# Patient Record
Sex: Female | Born: 1985 | Race: Black or African American | Hispanic: No | Marital: Married | Smoking: Never smoker
Health system: Southern US, Community
[De-identification: ages and names within clinical notes are randomized; demographics above are authoritative.]

## PROBLEM LIST (undated history)

## (undated) ENCOUNTER — Inpatient Hospital Stay (HOSPITAL_COMMUNITY): Payer: Self-pay

## (undated) DIAGNOSIS — F419 Anxiety disorder, unspecified: Secondary | ICD-10-CM

## (undated) DIAGNOSIS — F909 Attention-deficit hyperactivity disorder, unspecified type: Secondary | ICD-10-CM

## (undated) DIAGNOSIS — I10 Essential (primary) hypertension: Secondary | ICD-10-CM

## (undated) DIAGNOSIS — E669 Obesity, unspecified: Secondary | ICD-10-CM

## (undated) DIAGNOSIS — R87629 Unspecified abnormal cytological findings in specimens from vagina: Secondary | ICD-10-CM

## (undated) DIAGNOSIS — E039 Hypothyroidism, unspecified: Secondary | ICD-10-CM

## (undated) HISTORY — DX: Attention-deficit hyperactivity disorder, unspecified type: F90.9

## (undated) HISTORY — PX: DILATATION & CURRETTAGE/HYSTEROSCOPY WITH RESECTOCOPE: SHX5572

## (undated) HISTORY — PX: NO PAST SURGERIES: SHX2092

## (undated) HISTORY — DX: Obesity, unspecified: E66.9

## (undated) HISTORY — DX: Anxiety disorder, unspecified: F41.9

## (undated) HISTORY — DX: Unspecified abnormal cytological findings in specimens from vagina: R87.629

## (undated) HISTORY — PX: COLPOSCOPY: SHX161

---
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
## 2006-01-23 ENCOUNTER — Ambulatory Visit: Payer: Self-pay | Admitting: Family Medicine

## 2006-01-25 ENCOUNTER — Encounter: Admission: RE | Admit: 2006-01-25 | Discharge: 2006-01-25 | Payer: Self-pay | Admitting: Family Medicine

## 2011-01-29 LAB — ANTIBODY SCREEN: Antibody Screen: NEGATIVE

## 2011-01-29 LAB — ABO/RH: RH Type: POSITIVE

## 2011-01-29 LAB — HIV ANTIBODY (ROUTINE TESTING W REFLEX): HIV: NONREACTIVE

## 2011-04-14 LAB — HIV ANTIBODY (ROUTINE TESTING W REFLEX): HIV: NONREACTIVE

## 2011-04-14 LAB — RPR: RPR: NONREACTIVE

## 2011-06-29 ENCOUNTER — Inpatient Hospital Stay (HOSPITAL_COMMUNITY)
Admission: AD | Admit: 2011-06-29 | Discharge: 2011-06-29 | Disposition: A | Discharge status: Home / Self Care | Payer: BC Managed Care – PPO | Source: Ambulatory Visit | Attending: Obstetrics and Gynecology | Admitting: Obstetrics and Gynecology

## 2011-06-29 ENCOUNTER — Inpatient Hospital Stay (HOSPITAL_COMMUNITY): Payer: BC Managed Care – PPO

## 2011-06-29 ENCOUNTER — Encounter (HOSPITAL_COMMUNITY): Payer: Self-pay | Admitting: *Deleted

## 2011-06-29 DIAGNOSIS — IMO0002 Reserved for concepts with insufficient information to code with codable children: Secondary | ICD-10-CM | POA: Insufficient documentation

## 2011-06-29 DIAGNOSIS — M259 Joint disorder, unspecified: Secondary | ICD-10-CM | POA: Insufficient documentation

## 2011-06-29 DIAGNOSIS — Y990 Civilian activity done for income or pay: Secondary | ICD-10-CM | POA: Insufficient documentation

## 2011-06-29 DIAGNOSIS — X500XXA Overexertion from strenuous movement or load, initial encounter: Secondary | ICD-10-CM | POA: Insufficient documentation

## 2011-06-29 DIAGNOSIS — Y9229 Other specified public building as the place of occurrence of the external cause: Secondary | ICD-10-CM | POA: Insufficient documentation

## 2011-06-29 DIAGNOSIS — M899 Disorder of bone, unspecified: Secondary | ICD-10-CM | POA: Insufficient documentation

## 2011-06-29 LAB — WET PREP, GENITAL
Clue Cells Wet Prep HPF POC: NONE SEEN
Trich, Wet Prep: NONE SEEN
Yeast Wet Prep HPF POC: NONE SEEN

## 2011-06-29 MED ORDER — CYCLOBENZAPRINE HCL 10 MG PO TABS: 10.0000 mg | ORAL_TABLET | Freq: Three times a day (TID) | ORAL | Status: DC | PRN

## 2011-06-29 MED ORDER — CYCLOBENZAPRINE HCL 10 MG PO TABS
10.0000 mg | ORAL_TABLET | Freq: Once | ORAL | Status: AC
  Administered 2011-06-29: 10 mg via ORAL
  Filled 2011-06-29: qty 1

## 2011-06-29 NOTE — ED Provider Notes (Signed)
Chief Complaint:  Contractions   Brandy Pierce is  25 y.o. G2P0010.  Patient's last menstrual period was 01/31/2011.. [redacted]w[redacted]d   She presents complaining of Contractions and constant sharp shooting pain in left groin. States that she lifted a toddler at work and immediately left a sharp pain in groin that radiates into the vaginal. Reports tightening of left side q 3-5 minutes immediately following picking up the child, but have now resolved. Rate pain 9/10 at present, worse when lying on back some relief on side. Reports + FM, denies vag blding   Obstetrical/Gynecological History: OB History    Grav Para Term Preterm Abortions TAB SAB Ect Mult Living   2    1 1     0      Past Medical History: Past Medical History  Diagnosis Date  . No pertinent past medical history     Past Surgical History: Past Surgical History  Procedure Date  . No past surgeries     Family History: No family history on file.  Social History: History  Substance Use Topics  . Smoking status: Never Smoker   . Smokeless tobacco: Not on file  . Alcohol Use: No    Allergies: No Known Allergies  Prescriptions prior to admission  Medication Sig Dispense Refill  . prenatal vitamin w/FE, FA (PRENATAL 1 + 1) 27-1 MG TABS Take 1 tablet by mouth at bedtime.          Review of Systems - Negative except what is reviewed in the HPI  Physical Exam   Blood pressure 139/81, pulse 93, temperature 99 F (37.2 C), temperature source Oral, resp. rate 18, height 5\' 5"  (1.651 m), weight 107.956 kg (238 lb), last menstrual period 01/31/2011.  General: General appearance - alert, well appearing, and in no distress and overweight, uncomfortable appearing Mental status - alert, oriented to person, place, and time, normal mood, behavior, speech, dress, motor activity, and thought processes Abdomen - obese, soft, tenderness noted in left groin with palpation, no rebound tenderness noted Back exam - full range of motion, no  tenderness, palpable spasm or pain on motion Musculoskeletal - no joint tenderness, deformity or swelling Extremities - peripheral pulses normal, no pedal edema, no clubbing or cyanosis Focused Gynecological Exam: VULVA: normal appearing vulva with no masses, tenderness or lesions, VAGINA: vaginal discharge - white and copious, CERVIX: closed/thick, UTERUS: enlarged to 20 week's size, nontender, ADNEXA: normal adnexa in size, nontender and no masses  Labs: Recent Results (from the past 24 hour(s))  WET PREP, GENITAL   Collection Time   06/29/11  1:05 PM      Component Value Range   Yeast, Wet Prep NONE SEEN  NONE SEEN    Trich, Wet Prep NONE SEEN  NONE SEEN    Clue Cells, Wet Prep NONE SEEN  NONE SEEN    WBC, Wet Prep HPF POC FEW (*) NONE SEEN    Imaging Studies:  Ultrasound: Flow to both ovaries, no retroplacental bleed, + FM, CL 3.24cm   ED Course: Discussed with Dr. Vincente Poli. Obtain US  Assessment: Muscle Strain of Groin Pregnancy  Plan: Discharge home Out of work til Monday Rx Flexeril 10mg  prn   Zacharius Funari E. 06/29/2011,1:47 PM

## 2011-06-29 NOTE — Progress Notes (Signed)
Pt reprots she was at work and ran after a toddler and leaned down to catch him and pick him up felt a tug on her left side and had bad sharp pain and fel like it was pulsating and pt cant stand straight up. Feels like she is having ctx. Pt still having difficulty walking and pain isworse.

## 2011-07-07 ENCOUNTER — Inpatient Hospital Stay (HOSPITAL_COMMUNITY)
Admission: AD | Admit: 2011-07-07 | Discharge: 2011-07-07 | Disposition: A | Discharge status: Home / Self Care | Payer: BC Managed Care – PPO | Source: Ambulatory Visit | Attending: Obstetrics and Gynecology | Admitting: Obstetrics and Gynecology

## 2011-07-07 ENCOUNTER — Encounter (HOSPITAL_COMMUNITY): Payer: Self-pay

## 2011-07-07 ENCOUNTER — Inpatient Hospital Stay (HOSPITAL_COMMUNITY): Payer: BC Managed Care – PPO

## 2011-07-07 DIAGNOSIS — R109 Unspecified abdominal pain: Secondary | ICD-10-CM | POA: Insufficient documentation

## 2011-07-07 DIAGNOSIS — N949 Unspecified condition associated with female genital organs and menstrual cycle: Secondary | ICD-10-CM

## 2011-07-07 DIAGNOSIS — O99891 Other specified diseases and conditions complicating pregnancy: Secondary | ICD-10-CM | POA: Insufficient documentation

## 2011-07-07 NOTE — Progress Notes (Signed)
Pt sent over from office for possible inguinal hernia, reports spasms and sharp pains in llq.  States she believes she pulled a muscle last Thursday.

## 2011-07-07 NOTE — ED Provider Notes (Signed)
History     Chief Complaint  Patient presents with  . Abdominal Pain   HPI Patient is a 25yo G2P0010 at 22.3 weeks.  Seen for left sided abdominal pain that started approximately 1 week ago with radiation into the pelvis.  Pain began after lifting a child at work.  Was evaluated here last week and diagnosed with round ligament strain.  Pain hasn't resolved or decreased.   Denies pain radiating into leg, fevers, chills, nausea, vomiting, diarrhea, numbness, weakness.  The patient was seen by her Dr's office today, who sent her here for an ultrasound to r/o hernia.  OB History    Grav Para Term Preterm Abortions TAB SAB Ect Mult Living   2    1 1     0      Past Medical History  Diagnosis Date  . No pertinent past medical history     Past Surgical History  Procedure Date  . No past surgeries     No family history on file.  History  Substance Use Topics  . Smoking status: Never Smoker   . Smokeless tobacco: Not on file  . Alcohol Use: No    Allergies: No Known Allergies  Prescriptions prior to admission  Medication Sig Dispense Refill  . cyclobenzaprine (FLEXERIL) 10 MG tablet Take 1 tablet (10 mg total) by mouth every 8 (eight) hours as needed for muscle spasms.  30 tablet  1  . oxyCODONE-acetaminophen (PERCOCET) 5-325 MG per tablet Take 1 tablet by mouth every 4 (four) hours as needed.        . prenatal vitamin w/FE, FA (PRENATAL 1 + 1) 27-1 MG TABS Take 1 tablet by mouth at bedtime.          Review of Systems  All other systems reviewed and are negative.   Physical Exam   Blood pressure 156/101, pulse 133, temperature 98.3 F (36.8 C), temperature source Oral, resp. rate 16, height 5\' 6"  (1.676 m), weight 112.129 kg (247 lb 3.2 oz), last menstrual period 01/31/2011.  Physical Exam  Constitutional: She appears well-developed and well-nourished.  Eyes: Pupils are equal, round, and reactive to light.  GI: Soft. She exhibits no distension and no mass. There is  tenderness. There is no rebound and no guarding.   Abdominal US shows no evidence of femoral hernia. MAU Course  Procedures   Assessment and Plan  1. Round Ligament pain. Will give the patient lidocaine patch for pain.  Pt to follow up with Primary OB in 3 days.  Will have pt out of work for 1 week.   Kateland Leisinger JEHIEL 07/07/2011, 3:41 PM

## 2011-07-07 NOTE — Progress Notes (Signed)
Left lower abdominal pain since 1 week sent for [place]

## 2011-08-31 ENCOUNTER — Other Ambulatory Visit: Payer: Self-pay | Admitting: Obstetrics and Gynecology

## 2011-08-31 ENCOUNTER — Inpatient Hospital Stay (HOSPITAL_COMMUNITY)
Admission: AD | Admit: 2011-08-31 | Discharge: 2011-08-31 | Disposition: A | Discharge status: Home / Self Care | Payer: BC Managed Care – PPO | Source: Ambulatory Visit | Attending: Obstetrics and Gynecology | Admitting: Obstetrics and Gynecology

## 2011-08-31 ENCOUNTER — Encounter (HOSPITAL_COMMUNITY): Payer: Self-pay

## 2011-08-31 DIAGNOSIS — O139 Gestational [pregnancy-induced] hypertension without significant proteinuria, unspecified trimester: Secondary | ICD-10-CM

## 2011-08-31 DIAGNOSIS — O99891 Other specified diseases and conditions complicating pregnancy: Secondary | ICD-10-CM | POA: Insufficient documentation

## 2011-08-31 DIAGNOSIS — O169 Unspecified maternal hypertension, unspecified trimester: Secondary | ICD-10-CM

## 2011-08-31 DIAGNOSIS — R030 Elevated blood-pressure reading, without diagnosis of hypertension: Secondary | ICD-10-CM | POA: Insufficient documentation

## 2011-08-31 DIAGNOSIS — IMO0002 Reserved for concepts with insufficient information to code with codable children: Secondary | ICD-10-CM

## 2011-08-31 HISTORY — DX: Hypothyroidism, unspecified: E03.9

## 2011-08-31 LAB — CBC
HCT: 34.1 % — ABNORMAL LOW (ref 36.0–46.0)
Hemoglobin: 11.4 g/dL — ABNORMAL LOW (ref 12.0–15.0)
MCH: 29.2 pg (ref 26.0–34.0)
MCHC: 33.4 g/dL (ref 30.0–36.0)
RDW: 12.7 % (ref 11.5–15.5)

## 2011-08-31 LAB — COMPREHENSIVE METABOLIC PANEL
Albumin: 3.1 g/dL — ABNORMAL LOW (ref 3.5–5.2)
Alkaline Phosphatase: 67 U/L (ref 39–117)
BUN: 5 mg/dL — ABNORMAL LOW (ref 6–23)
Calcium: 9.7 mg/dL (ref 8.4–10.5)
Creatinine, Ser: 0.56 mg/dL (ref 0.50–1.10)
GFR calc Af Amer: >90 mL/min (ref >90)
Glucose, Bld: 87 mg/dL (ref 70–99)
Potassium: 3.6 mEq/L (ref 3.5–5.1)
Total Protein: 6.4 g/dL (ref 6.0–8.3)

## 2011-08-31 LAB — URIC ACID: Uric Acid, Serum: 5.1 mg/dL (ref 2.4–7.0)

## 2011-08-31 NOTE — ED Provider Notes (Signed)
History     Chief Complaint  Patient presents with  . Hypertension   HPI  Pt is 30weeks 2 d pregnant and was seen in the office this morning for 2 week check up with elevated BP of 150/?Marland Kitchen  Brandy Pierce has had 2 week interval check ups with BP and has had elevated GTT.  Brandy Pierce had a fall Sept 6  With lots of pain with round ligament pain.  Brandy Pierce had a headache from 6 pm until 11 am which Brandy Pierce did not take any medication.  The headache is gone now.  Brandy Pierce typically has dry heaves in the morning- no contractions no pelvic pressure and no diarrhea or contsipation or UTI symptoms.  Brandy Pierce is here for [facility] evaluation.  Brandy Pierce denies vaginal discharge or leakage or bleeding.    Past Medical History  Diagnosis Date  . Polycystic disease, ovaries   . Hypothyroidism     Past Surgical History  Procedure Date  . No past surgeries     No family history on file.  History  Substance Use Topics  . Smoking status: Never Smoker   . Smokeless tobacco: Not on file  . Alcohol Use: No    Allergies: No Known Allergies  Prescriptions prior to admission  Medication Sig Dispense Refill  . acetaminophen (TYLENOL) 325 MG tablet Take 650 mg by mouth every 6 (six) hours as needed. For headache       . calcium carbonate (TUMS - DOSED IN MG ELEMENTAL CALCIUM) 500 MG chewable tablet Chew 1 tablet by mouth daily as needed. For heartburn       . prenatal vitamin w/FE, FA (PRENATAL 1 + 1) 27-1 MG TABS Take 1 tablet by mouth at bedtime.          Review of Systems  Constitutional: Negative for fever and chills.  Eyes: Negative for blurred vision and double vision.  Genitourinary: Negative for dysuria and frequency.  Neurological: Negative for dizziness and headaches.   Physical Exam   Blood pressure 129/84, pulse 100, temperature 99.1 F (37.3 C), temperature source Oral, resp. rate 20, height 5\' 6"  (1.676 m), weight 261 lb 3.2 oz (118.48 kg), last menstrual period 01/31/2011, SpO2 98.00%.  Physical Exam  Constitutional: Brandy Pierce  is oriented to person, place, and time. Brandy Pierce appears well-developed and well-nourished.  Eyes: Pupils are equal, round, and reactive to light.  Neck: Normal range of motion.  Cardiovascular: Normal rate.   Respiratory: Effort normal.  GI: Soft. Brandy Pierce exhibits no distension.       FHR reactive 15x15 without decelerations; no ctx noted  Musculoskeletal: Normal range of motion.  Neurological: Brandy Pierce is alert and oriented to person, place, and time.  Skin: Skin is warm and dry.  Psychiatric: Brandy Pierce has a normal mood and affect.    MAU Course  Procedures U/a negative in  Office Serial BP with last BP 129/84 PIH labs normal Discussed with Dr. Vincente Poli- pt may go home and f/u in office next week for BP and OB f/u visit      Harvy Riera 08/31/2011, 11:22 AM

## 2011-08-31 NOTE — Progress Notes (Signed)
Patient states she was sent from the office for evaluation of elevated blood pressure. Has some swelling and pelvic pain. Reports good fetal movement.

## 2011-09-27 ENCOUNTER — Inpatient Hospital Stay (HOSPITAL_COMMUNITY)
Admission: AD | Admit: 2011-09-27 | Discharge: 2011-09-27 | Disposition: A | Discharge status: Home / Self Care | Payer: BC Managed Care – PPO | Source: Ambulatory Visit | Attending: Obstetrics and Gynecology | Admitting: Obstetrics and Gynecology

## 2011-09-27 ENCOUNTER — Other Ambulatory Visit: Payer: Self-pay | Admitting: Obstetrics and Gynecology

## 2011-09-27 ENCOUNTER — Inpatient Hospital Stay (HOSPITAL_COMMUNITY): Payer: BC Managed Care – PPO

## 2011-09-27 ENCOUNTER — Encounter (HOSPITAL_COMMUNITY): Payer: Self-pay | Admitting: *Deleted

## 2011-09-27 DIAGNOSIS — O368190 Decreased fetal movements, unspecified trimester, not applicable or unspecified: Secondary | ICD-10-CM | POA: Insufficient documentation

## 2011-09-27 DIAGNOSIS — O368130 Decreased fetal movements, third trimester, not applicable or unspecified: Secondary | ICD-10-CM

## 2011-09-27 DIAGNOSIS — Z8632 Personal history of gestational diabetes: Secondary | ICD-10-CM

## 2011-09-27 DIAGNOSIS — O99810 Abnormal glucose complicating pregnancy: Secondary | ICD-10-CM | POA: Insufficient documentation

## 2011-09-27 LAB — COMPREHENSIVE METABOLIC PANEL
Albumin: 3 g/dL — ABNORMAL LOW (ref 3.5–5.2)
Alkaline Phosphatase: 96 U/L (ref 39–117)
BUN: 5 mg/dL — ABNORMAL LOW (ref 6–23)
Creatinine, Ser: 0.61 mg/dL (ref 0.50–1.10)
GFR calc Af Amer: >90 mL/min (ref >90)
Glucose, Bld: 101 mg/dL — ABNORMAL HIGH (ref 70–99)
Potassium: 3.8 mEq/L (ref 3.5–5.1)
Total Protein: 6.6 g/dL (ref 6.0–8.3)

## 2011-09-27 LAB — CBC
HCT: 34.6 % — ABNORMAL LOW (ref 36.0–46.0)
Hemoglobin: 11.6 g/dL — ABNORMAL LOW (ref 12.0–15.0)
MCH: 28.5 pg (ref 26.0–34.0)
MCHC: 33.5 g/dL (ref 30.0–36.0)
MCV: 85 fL (ref 78.0–100.0)
RDW: 12.9 % (ref 11.5–15.5)

## 2011-09-27 NOTE — Progress Notes (Signed)
Was on monitor if office, 'not moving as much as they wanted'.  Is gest diabetic.  Has been trying to set up counseling, having problems with that.

## 2011-09-27 NOTE — Progress Notes (Signed)
No pain, is having contractions- but no pain.  Does have pelvic pressure.

## 2011-09-27 NOTE — ED Provider Notes (Signed)
History     Chief Complaint  Patient presents with  . Decreased Fetal Movement   HPI JALAYSHA SKILTON is 25 y.o. G2P0010 [redacted]w[redacted]d weeks presenting after office visit this am.  Had NST in office and was told it was not reactive and was sent to MAU for longer testing. States she had less fetal movement last night but has felt movement today.  Denies pain, vaginal bleeding or leaking of fluid. Feeling some tightening more frequently not stronger.  Increased pelvic pressure when she has the need to urinate that is resolved after urination.  Urine checked in office today.   Patient has hx of PCOS and has gestational diabetes with this pregnancy.    Past Medical History  Diagnosis Date  . Polycystic disease, ovaries   . Hypothyroidism   . Gestational diabetes     Past Surgical History  Procedure Date  . No past surgeries     Family History  Problem Relation Age of Onset  . Anesthesia problems Neg Hx     History  Substance Use Topics  . Smoking status: Never Smoker   . Smokeless tobacco: Never Used  . Alcohol Use: No    Allergies: No Known Allergies  Prescriptions prior to admission  Medication Sig Dispense Refill  . acetaminophen (TYLENOL) 325 MG tablet Take 650 mg by mouth every 6 (six) hours as needed. For headache       . calcium carbonate (TUMS - DOSED IN MG ELEMENTAL CALCIUM) 500 MG chewable tablet Chew 1 tablet by mouth daily as needed. For heartburn       . prenatal vitamin w/FE, FA (PRENATAL 1 + 1) 27-1 MG TABS Take 1 tablet by mouth at bedtime.          Review of Systems  Constitutional: Negative.   Gastrointestinal: Negative.   Genitourinary:       Occ tightening Negative for vaginal bleeding or leaking of fluid   Physical Exam   Blood pressure 136/80, pulse 106, temperature 98.8 F (37.1 C), temperature source Oral, resp. rate 20, height 5\' 6"  (1.676 m), weight 261 lb (118.389 kg), last menstrual period 01/31/2011.  Physical Exam  Constitutional: She is  oriented to person, place, and time. She appears well-developed and well-nourished. No distress.  HENT:  Head: Normocephalic.  Neurological: She is alert and oriented to person, place, and time.  Skin: Skin is warm and dry.   Results for orders placed during the hospital encounter of 09/27/11 (from the past 24 hour(s))  CBC     Status: Abnormal   Collection Time   09/27/11  1:41 PM      Component Value Range   WBC 11.2 (*) 4.0 - 10.5 (K/uL)   RBC 4.07  3.87 - 5.11 (MIL/uL)   Hemoglobin 11.6 (*) 12.0 - 15.0 (g/dL)   HCT 16.1 (*) 09.6 - 46.0 (%)   MCV 85.0  78.0 - 100.0 (fL)   MCH 28.5  26.0 - 34.0 (pg)   MCHC 33.5  30.0 - 36.0 (g/dL)   RDW 04.5  40.9 - 81.1 (%)   Platelets 239  150 - 400 (K/uL)  COMPREHENSIVE METABOLIC PANEL     Status: Abnormal   Collection Time   09/27/11  1:41 PM      Component Value Range   Sodium 137  135 - 145 (mEq/L)   Potassium 3.8  3.5 - 5.1 (mEq/L)   Chloride 104  96 - 112 (mEq/L)   CO2 21  19 - 32 (  mEq/L)   Glucose, Bld 101 (*) 70 - 99 (mg/dL)   BUN 5 (*) 6 - 23 (mg/dL)   Creatinine, Ser 1.61  0.50 - 1.10 (mg/dL)   Calcium 9.8  8.4 - 09.6 (mg/dL)   Total Protein 6.6  6.0 - 8.3 (g/dL)   Albumin 3.0 (*) 3.5 - 5.2 (g/dL)   AST 18  0 - 37 (U/L)   ALT 14  0 - 35 (U/L)   Alkaline Phosphatase 96  39 - 117 (U/L)   Total Bilirubin 0.4  0.3 - 1.2 (mg/dL)   GFR calc non Af Amer >90  >90 (mL/min)   GFR calc Af Amer >90  >90 (mL/min)   Ultrasound results:  FHR 138, cephalic, placenta posterior above cervical os, marginal insertion.  AFI 10.88, low-normal range.  largest pocket 3.76 cm.  BPP 8/8.  [redacted]w[redacted]d.   MAU Course  Procedures  FMS reactive, FHR 135 and rare contraction.  MDM 15:40.  Called Dr. Marcelle Overlie and reported Lab results, ultrasound and reactive NST results.  BPP 8/8.  He reviewed Dr. Marvetta Gibbons note and instructed me to tell her the office would call her for a followup appointment.    Assessment and Plan  A: Decreased fetal movement (non reassuring  NST in office)  P:  Reviewed results with the patient.  Reported the office would call her to schedule a followup appointment.   KEY,[name] 09/27/2011, 1:17 PM   Matt Holmes, NP 09/27/11 1553

## 2011-10-04 ENCOUNTER — Encounter: Payer: BC Managed Care – PPO | Attending: Obstetrics and Gynecology | Admitting: *Deleted

## 2011-10-04 DIAGNOSIS — E119 Type 2 diabetes mellitus without complications: Secondary | ICD-10-CM | POA: Insufficient documentation

## 2011-10-04 DIAGNOSIS — Z713 Dietary counseling and surveillance: Secondary | ICD-10-CM | POA: Insufficient documentation

## 2011-10-05 NOTE — Patient Instructions (Signed)
Goals:  Check glucose levels per MD as instructed  Follow Gestational Diabetes Diet as instructed  Call for follow-up as needed    

## 2011-10-05 NOTE — Progress Notes (Signed)
  Patient was seen on 10/04/2011 for Gestational Diabetes self-management class at the [facility]. The following learning objectives were met by the patient during this course:   States the definition of Gestational Diabetes  States why dietary management is important in controlling blood glucose  Describes the effects each nutrient has on blood glucose levels  Demonstrates ability to create a balanced meal plan  Demonstrates carbohydrate counting   States when to check blood glucose levels  Demonstrates proper blood glucose monitoring techniques  States the effect of stress and exercise on blood glucose levels  States the importance of limiting caffeine and abstaining from alcohol and smoking  Blood glucose monitor given: One Touch Ultra Mini BG Monitoring Kit Lot # [id number] X Exp: 02/2012 Blood glucose reading: 92 mg/dl  Patient instructed to monitor glucose levels: FBS: 60 - <90 2 hour: <120  *Patient received handouts:  Nutrition Diabetes and Pregnancy  Carbohydrate Counting List  Patient will be seen for follow-up as needed.

## 2011-10-12 LAB — GC/CHLAMYDIA PROBE AMP, GENITAL: Gonorrhea: NEGATIVE

## 2011-10-12 LAB — STREP B DNA PROBE: GBS: NEGATIVE

## 2011-10-20 ENCOUNTER — Encounter (HOSPITAL_COMMUNITY): Payer: Self-pay | Admitting: *Deleted

## 2011-10-20 ENCOUNTER — Inpatient Hospital Stay (HOSPITAL_COMMUNITY): Payer: BC Managed Care – PPO

## 2011-10-20 ENCOUNTER — Inpatient Hospital Stay (HOSPITAL_COMMUNITY)
Admission: AD | Admit: 2011-10-20 | Discharge: 2011-10-20 | Disposition: A | Discharge status: Home / Self Care | Payer: BC Managed Care – PPO | Source: Ambulatory Visit | Attending: Obstetrics and Gynecology | Admitting: Obstetrics and Gynecology

## 2011-10-20 DIAGNOSIS — O99810 Abnormal glucose complicating pregnancy: Secondary | ICD-10-CM | POA: Insufficient documentation

## 2011-10-20 DIAGNOSIS — O139 Gestational [pregnancy-induced] hypertension without significant proteinuria, unspecified trimester: Secondary | ICD-10-CM

## 2011-10-20 DIAGNOSIS — O169 Unspecified maternal hypertension, unspecified trimester: Secondary | ICD-10-CM

## 2011-10-20 LAB — COMPREHENSIVE METABOLIC PANEL
AST: 21 U/L (ref 0–37)
Albumin: 3 g/dL — ABNORMAL LOW (ref 3.5–5.2)
Alkaline Phosphatase: 145 U/L — ABNORMAL HIGH (ref 39–117)
BUN: 12 mg/dL (ref 6–23)
Chloride: 104 mEq/L (ref 96–112)
Potassium: 4.6 mEq/L (ref 3.5–5.1)
Sodium: 135 mEq/L (ref 135–145)
Total Bilirubin: 0.3 mg/dL (ref 0.3–1.2)
Total Protein: 6.2 g/dL (ref 6.0–8.3)

## 2011-10-20 LAB — CBC
HCT: 37.8 % (ref 36.0–46.0)
MCHC: 33.6 g/dL (ref 30.0–36.0)
Platelets: 254 10*3/uL (ref 150–400)
RDW: 13.5 % (ref 11.5–15.5)
WBC: 10.1 10*3/uL (ref 4.0–10.5)

## 2011-10-20 LAB — LACTATE DEHYDROGENASE: LDH: 225 U/L (ref 94–250)

## 2011-10-20 LAB — URINALYSIS, ROUTINE W REFLEX MICROSCOPIC
Bilirubin Urine: NEGATIVE
Leukocytes, UA: NEGATIVE
Nitrite: NEGATIVE
Specific Gravity, Urine: 1.02 (ref 1.005–1.030)
Urobilinogen, UA: 0.2 mg/dL (ref 0.0–1.0)
pH: 6.5 (ref 5.0–8.0)

## 2011-10-20 LAB — URIC ACID: Uric Acid, Serum: 6.9 mg/dL (ref 2.4–7.0)

## 2011-10-20 MED ORDER — OXYCODONE-ACETAMINOPHEN 5-325 MG PO TABS: 2.0000 | ORAL_TABLET | ORAL | Status: AC | PRN

## 2011-10-20 MED ORDER — ZOLPIDEM TARTRATE 5 MG PO TABS: 5.0000 mg | ORAL_TABLET | Freq: Every evening | ORAL | Status: DC | PRN

## 2011-10-20 MED ORDER — ACETAMINOPHEN 325 MG PO TABS: 650.0000 mg | ORAL_TABLET | Freq: Once | ORAL | Status: DC

## 2011-10-20 NOTE — Progress Notes (Signed)
Patient sent from the office for evaluation of elevated blood pressure. Reports good fetal movement, slight discharge that is not new, no bleeding or leaking and some pelvic pressure.

## 2011-10-20 NOTE — ED Provider Notes (Signed)
History     Chief Complaint  Patient presents with  . Hypertension   HPI   Pt was seen in the office this morning with BP 160/98.  She had a trace of protein in her urine.  She has had contractions since Tuesday.  Last night she had ctx 11 minutes apart. Pt states her lower back as well as her abdomen are uncomfortable, but she has not taken any medications for the pain.  She has had a difficult time sleeping due to her pain. She has had some clear vaginal discharge.  Her cervix was closed and thinning on Wednesday.  She has been seen on several occasions with PIH evaluation.  She also has gestational diabetes.  Her BS was 88 this morning. The baby is measuring large for dates. She denies headaches.  She has had nausea this morning vomiting. yellow bile.  Baby is active.    Past Medical History  Diagnosis Date  . Polycystic disease, ovaries   . Hypothyroidism   . Gestational diabetes   . Obesity     Past Surgical History  Procedure Date  . No past surgeries     Family History  Problem Relation Age of Onset  . Anesthesia problems Neg Hx     History  Substance Use Topics  . Smoking status: Never Smoker   . Smokeless tobacco: Never Used  . Alcohol Use: No    Allergies: No Known Allergies  Prescriptions prior to admission  Medication Sig Dispense Refill  . prenatal vitamin w/FE, FA (PRENATAL 1 + 1) 27-1 MG TABS Take 1 tablet by mouth at bedtime.         ROS Physical Exam   Blood pressure 139/96, pulse 86, resp. rate 20, height 5\' 5"  (1.651 m), weight 265 lb 12.8 oz (120.566 kg), last menstrual period 01/31/2011, SpO2 98.00%.  Physical Exam  Constitutional: She is oriented to person, place, and time. She appears well-developed and well-nourished.  HENT:  Head: Normocephalic.  Eyes: Pupils are equal, round, and reactive to light.  Neck: Normal range of motion. Neck supple.  Cardiovascular: Normal rate.        Serial BPs 139/92-144/101-150/89  Respiratory: Effort  normal.  GI: Soft. She exhibits no distension. There is no tenderness. There is no rebound.       Back pain and lower abdominal pain; rare contractions with reactive NST  Musculoskeletal: Normal range of motion. She exhibits no edema.  Neurological: She is alert and oriented to person, place, and time.  Skin: Skin is warm and dry.  Psychiatric: She has a normal mood and affect.    MAU Course  Procedures Results for orders placed during the hospital encounter of 10/20/11 (from the past 24 hour(s))  URINALYSIS, ROUTINE W REFLEX MICROSCOPIC     Status: Normal   Collection Time   10/20/11 11:50 AM      Component Value Range   Color, Urine YELLOW  YELLOW    APPearance CLEAR  CLEAR    Specific Gravity, Urine 1.020  1.005 - 1.030    pH 6.5  5.0 - 8.0    Glucose, UA NEGATIVE  NEGATIVE (mg/dL)   Hgb urine dipstick NEGATIVE  NEGATIVE    Bilirubin Urine NEGATIVE  NEGATIVE    Ketones, ur NEGATIVE  NEGATIVE (mg/dL)   Protein, ur NEGATIVE  NEGATIVE (mg/dL)   Urobilinogen, UA 0.2  0.0 - 1.0 (mg/dL)   Nitrite NEGATIVE  NEGATIVE    Leukocytes, UA NEGATIVE  NEGATIVE  URIC ACID     Status: Normal   Collection Time   10/20/11 12:00 PM      Component Value Range   Uric Acid, Serum 6.9  2.4 - 7.0 (mg/dL)  LACTATE DEHYDROGENASE     Status: Normal   Collection Time   10/20/11 12:00 PM      Component Value Range   LD 225  94 - 250 (U/L)  CBC     Status: Normal   Collection Time   10/20/11 12:00 PM      Component Value Range   WBC 10.1  4.0 - 10.5 (K/uL)   RBC 4.59  3.87 - 5.11 (MIL/uL)   Hemoglobin 12.7  12.0 - 15.0 (g/dL)   HCT 78.2  95.6 - 21.3 (%)   MCV 82.4  78.0 - 100.0 (fL)   MCH 27.7  26.0 - 34.0 (pg)   MCHC 33.6  30.0 - 36.0 (g/dL)   RDW 08.6  57.8 - 46.9 (%)   Platelets 254  150 - 400 (K/uL)  COMPREHENSIVE METABOLIC PANEL     Status: Abnormal   Collection Time   10/20/11 12:00 PM      Component Value Range   Sodium 135  135 - 145 (mEq/L)   Potassium 4.6  3.5 - 5.1 (mEq/L)    Chloride 104  96 - 112 (mEq/L)   CO2 21  19 - 32 (mEq/L)   Glucose, Bld 94  70 - 99 (mg/dL)   BUN 12  6 - 23 (mg/dL)   Creatinine, Ser 6.29  0.50 - 1.10 (mg/dL)   Calcium 9.8  8.4 - 52.8 (mg/dL)   Total Protein 6.2  6.0 - 8.3 (g/dL)   Albumin 3.0 (*) 3.5 - 5.2 (g/dL)   AST 21  0 - 37 (U/L)   ALT 16  0 - 35 (U/L)   Alkaline Phosphatase 145 (*) 39 - 117 (U/L)   Total Bilirubin 0.3  0.3 - 1.2 (mg/dL)   GFR calc non Af Amer 84 (*) >90 (mL/min)   GFR calc Af Amer >90  >90 (mL/min)  discussed with Dr. Arelia Sneddon- pt will be discharged home and return to MAU in the morning for repeat BP and BPP  Assessment and Plan  Hypertension in pregnancy Gestational Diabetes Normal PIH labs, reactive NST Back pain- prescription for Percocet and Ambien given to pt to help her rest at night- pt will try Tylenol for back pain this afternoon at home [facility] 6/8 F/u tomorrow morning in MAU for BP eval and repeat BPP- call Dr. Gabriel Cirri 10/20/2011, 12:19 PM

## 2011-10-24 ENCOUNTER — Encounter (HOSPITAL_COMMUNITY): Payer: Self-pay | Admitting: *Deleted

## 2011-10-24 ENCOUNTER — Inpatient Hospital Stay (HOSPITAL_COMMUNITY)
Admission: AD | Admit: 2011-10-24 | Discharge: 2011-10-29 | DRG: 774 | Disposition: A | Discharge status: Home / Self Care | Payer: Medicaid Other | Source: Ambulatory Visit | Attending: Obstetrics and Gynecology | Admitting: Obstetrics and Gynecology

## 2011-10-24 DIAGNOSIS — O1490 Unspecified pre-eclampsia, unspecified trimester: Secondary | ICD-10-CM

## 2011-10-24 DIAGNOSIS — IMO0002 Reserved for concepts with insufficient information to code with codable children: Secondary | ICD-10-CM | POA: Diagnosis present

## 2011-10-24 DIAGNOSIS — D649 Anemia, unspecified: Secondary | ICD-10-CM

## 2011-10-24 DIAGNOSIS — O99814 Abnormal glucose complicating childbirth: Secondary | ICD-10-CM | POA: Diagnosis present

## 2011-10-24 LAB — URINALYSIS, ROUTINE W REFLEX MICROSCOPIC
Ketones, ur: NEGATIVE mg/dL
Leukocytes, UA: NEGATIVE
Nitrite: NEGATIVE
Protein, ur: 100 mg/dL — AB

## 2011-10-24 LAB — COMPREHENSIVE METABOLIC PANEL
Albumin: 2.9 g/dL — ABNORMAL LOW (ref 3.5–5.2)
BUN: 10 mg/dL (ref 6–23)
CO2: 24 mEq/L (ref 19–32)
Calcium: 9.2 mg/dL (ref 8.4–10.5)
Chloride: 106 mEq/L (ref 96–112)
Creatinine, Ser: 0.95 mg/dL (ref 0.50–1.10)
GFR calc non Af Amer: 83 mL/min — ABNORMAL LOW (ref >90)
Total Bilirubin: 0.3 mg/dL (ref 0.3–1.2)

## 2011-10-24 LAB — URIC ACID: Uric Acid, Serum: 7.1 mg/dL — ABNORMAL HIGH (ref 2.4–7.0)

## 2011-10-24 LAB — CBC
HCT: 35.5 % — ABNORMAL LOW (ref 36.0–46.0)
MCH: 27.7 pg (ref 26.0–34.0)
MCV: 82.8 fL (ref 78.0–100.0)
RDW: 13.4 % (ref 11.5–15.5)
WBC: 8.6 10*3/uL (ref 4.0–10.5)

## 2011-10-24 LAB — URINE MICROSCOPIC-ADD ON

## 2011-10-24 MED ORDER — OXYCODONE-ACETAMINOPHEN 5-325 MG PO TABS
2.0000 | ORAL_TABLET | ORAL | Status: DC | PRN
  Administered 2011-10-27: 2 via ORAL
  Filled 2011-10-24: qty 2

## 2011-10-24 MED ORDER — IBUPROFEN 600 MG PO TABS
600.0000 mg | ORAL_TABLET | Freq: Four times a day (QID) | ORAL | Status: DC | PRN
  Administered 2011-10-27: 600 mg via ORAL
  Filled 2011-10-24: qty 1

## 2011-10-24 MED ORDER — OXYTOCIN 20 UNITS IN LACTATED RINGERS INFUSION - SIMPLE
125.0000 mL/h | Freq: Once | INTRAVENOUS | Status: AC
  Administered 2011-10-27: 125 mL/h via INTRAVENOUS
  Filled 2011-10-24: qty 1000

## 2011-10-24 MED ORDER — DINOPROSTONE 10 MG VA INST
10.0000 mg | VAGINAL_INSERT | Freq: Once | VAGINAL | Status: AC
  Administered 2011-10-24: 10 mg via VAGINAL
  Filled 2011-10-24: qty 1

## 2011-10-24 MED ORDER — MAGNESIUM SULFATE 40 G IN LACTATED RINGERS - SIMPLE
2.0000 g/h | INTRAVENOUS | Status: DC
  Administered 2011-10-25 – 2011-10-27 (×3): 2 g/h via INTRAVENOUS
  Filled 2011-10-24 (×4): qty 500

## 2011-10-24 MED ORDER — LIDOCAINE HCL (PF) 1 % IJ SOLN
30.0000 mL | INTRAMUSCULAR | Status: DC | PRN
  Filled 2011-10-24: qty 30

## 2011-10-24 MED ORDER — LABETALOL HCL 5 MG/ML IV SOLN
10.0000 mg | INTRAVENOUS | Status: DC | PRN
  Administered 2011-10-24 – 2011-10-26 (×4): 10 mg via INTRAVENOUS
  Filled 2011-10-24 (×5): qty 4

## 2011-10-24 MED ORDER — LACTATED RINGERS IV SOLN
INTRAVENOUS | Status: DC
  Administered 2011-10-24: 18:00:00 via INTRAVENOUS
  Administered 2011-10-25: 70 mL/h via INTRAVENOUS
  Administered 2011-10-25: 02:00:00 via INTRAVENOUS
  Administered 2011-10-26: 100 mL/h via INTRAVENOUS
  Administered 2011-10-26: 11:00:00 via INTRAVENOUS
  Administered 2011-10-26: 100 mL/h via INTRAVENOUS
  Administered 2011-10-27: 64 mL/h via INTRAVENOUS
  Administered 2011-10-27: 100 mL/h via INTRAVENOUS

## 2011-10-24 MED ORDER — FLEET ENEMA 7-19 GM/118ML RE ENEM: 1.0000 | ENEMA | RECTAL | Status: DC | PRN

## 2011-10-24 MED ORDER — OXYTOCIN BOLUS FROM INFUSION
500.0000 mL | Freq: Once | INTRAVENOUS | Status: DC
  Filled 2011-10-24: qty 500

## 2011-10-24 MED ORDER — ZOLPIDEM TARTRATE 10 MG PO TABS
10.0000 mg | ORAL_TABLET | Freq: Every evening | ORAL | Status: DC | PRN
  Administered 2011-10-24: 10 mg via ORAL
  Filled 2011-10-24: qty 1

## 2011-10-24 MED ORDER — BUTORPHANOL TARTRATE 2 MG/ML IJ SOLN
1.0000 mg | INTRAMUSCULAR | Status: DC | PRN
  Administered 2011-10-25 – 2011-10-26 (×2): 1 mg via INTRAVENOUS
  Filled 2011-10-24 (×4): qty 1

## 2011-10-24 MED ORDER — ONDANSETRON HCL 4 MG/2ML IJ SOLN
4.0000 mg | Freq: Four times a day (QID) | INTRAMUSCULAR | Status: DC | PRN
  Administered 2011-10-26: 4 mg via INTRAVENOUS
  Filled 2011-10-24: qty 2

## 2011-10-24 MED ORDER — ACETAMINOPHEN 325 MG PO TABS
650.0000 mg | ORAL_TABLET | ORAL | Status: DC | PRN
  Administered 2011-10-25 (×2): 650 mg via ORAL
  Filled 2011-10-24 (×2): qty 2

## 2011-10-24 MED ORDER — TERBUTALINE SULFATE 1 MG/ML IJ SOLN: 0.2500 mg | Freq: Once | INTRAMUSCULAR | Status: AC | PRN

## 2011-10-24 MED ORDER — LACTATED RINGERS IV SOLN: 500.0000 mL | INTRAVENOUS | Status: DC | PRN

## 2011-10-24 MED ORDER — CITRIC ACID-SODIUM CITRATE 334-500 MG/5ML PO SOLN: 30.0000 mL | ORAL | Status: DC | PRN

## 2011-10-24 MED ORDER — MAGNESIUM SULFATE BOLUS VIA INFUSION
4.0000 g | Freq: Once | INTRAVENOUS | Status: AC
  Administered 2011-10-24: 4 g via INTRAVENOUS
  Filled 2011-10-24: qty 500

## 2011-10-24 NOTE — Progress Notes (Signed)
Patient states she started having cramping, dizziness, blurred vision and shortness of breath at 0400. No bleeding or leaking, has an increase in vaginal discharge. Reports good fetal movement.

## 2011-10-24 NOTE — Progress Notes (Signed)
Bedside U/S by me= vertex

## 2011-10-24 NOTE — ED Notes (Signed)
Patient upset with plan of care from office. According to her perception, she is receiving conflicting information regarding home care, when to come to hospital and what will prompt delivery of baby. Lynder Parents, NP talked with Dr. Henderson Cloud and he is coming to see patient.  Patient allowed to ventilate feelings. Patient opinion received with sincerity by RN and NP. Patient makes little eye contact. Patient not receptive to consolation or comfort measures at this time. Blood draw held for now until MD sees patient and discusses status and plan of care.

## 2011-10-24 NOTE — ED Provider Notes (Signed)
History     Chief Complaint  Patient presents with  . Abdominal Cramping  . Dizziness  . Blurred Vision  . Leg Swelling   HPIArlene C Pierce is 26 y.o. G2P0010 [redacted]w[redacted]d weeks presenting with blurred vision, upper abdominal pain and a headache. Patient of Dr. Raynaldo Opitz. States she had elevated blood pressures at 6 months gestation.  Was seen in the office yesterday by Dr. Henderson Cloud.  Reports leaking of fluid at 9am.  Reports lower abdominal and back pressure.  Denies vaginal bleeding.  + for fetal movement.  Patient is upset stating she was told if she began with PIH sxs, she would be induced.  Told she was 70% effaced and cervix was closed by exam yesterday in the office.    Past Medical History  Diagnosis Date  . Polycystic disease, ovaries   . Hypothyroidism   . Gestational diabetes   . Obesity     Past Surgical History  Procedure Date  . No past surgeries     Family History  Problem Relation Age of Onset  . Anesthesia problems Neg Hx     History  Substance Use Topics  . Smoking status: Never Smoker   . Smokeless tobacco: Never Used  . Alcohol Use: No    Allergies: No Known Allergies  Prescriptions prior to admission  Medication Sig Dispense Refill  . acetaminophen (TYLENOL) 500 MG tablet Take 1,000 mg by mouth every 6 (six) hours as needed. pain       . oxyCODONE-acetaminophen (PERCOCET) 5-325 MG per tablet Take 2 tablets by mouth every 4 (four) hours as needed for pain.  15 tablet  0  . prenatal vitamin w/FE, FA (PRENATAL 1 + 1) 27-1 MG TABS Take 1 tablet by mouth at bedtime.         ROS Physical Exam   Blood pressure 142/90, pulse 90, temperature 99.2 F (37.3 C), temperature source Oral, resp. rate 24, height 5\' 5"  (1.651 m), weight 265 lb (120.203 kg), last menstrual period 01/31/2011, SpO2 96.00%.  Physical Exam  MAU Course  Procedures  MDM 15:40  I called Dr. Henderson Cloud to let him know the patient was upset.  He is on his way to the hospital and will see  patient. I reported her blood pressure.   Assessment and Plan    KEY,[name] 10/24/2011, 3:48 PM   Matt Holmes, NP 10/24/11 1555

## 2011-10-24 NOTE — H&P (Signed)
Brandy Pierce is a 26 y.o. female presenting for C/O new onset of severe HA since late last pm.Was evaluated in office yesterday.  BPs have been increasingly labile. Yesterday labs were normal and urine protein was 1+ on dipstick.  Plan was rest and reevaluation on 10/25/10.  Instructions for MBR and reveiwed symptoms of preeclampsia were discussed.  Apparently went to a party last night.  Has had HA and now intermitently blurry vision.  Took Percocet about 2 am with only partial relief of HA which persisted and got bad again. No history of migraine HA.  Also, C/O epigastric pain. PNC also complicated by probable GDM.  Had elevated 1 hour GTT. Was unable to tolerate 3 hour test.  Had uncertain success in monitoring blood sugars.  Also, fetal pyelectasis noted on U/S with no dilation of ureters. Maternal Medical History:  Fetal activity: Perceived fetal activity is normal.      OB History    Grav Para Term Preterm Abortions TAB SAB Ect Mult Living   2    1 1     0     Past Medical History  Diagnosis Date  . Polycystic disease, ovaries   . Hypothyroidism   . Gestational diabetes   . Obesity    Past Surgical History  Procedure Date  . No past surgeries    Family History: family history is negative for Anesthesia problems. Social History:  reports that she has never smoked. She has never used smokeless tobacco. She reports that she uses illicit drugs (Marijuana). She reports that she does not drink alcohol.  Review of Systems  HENT:       Severe HA since late last pm. Took Percocet about 2 am with partial relief and HA persisted and got bad again.   Eyes: Positive for blurred vision.       C/O intermittent blurry vision with HA all day  Gastrointestinal: Negative for abdominal pain.  Neurological: Positive for headaches.    Dilation: Closed Effacement (%): 70 Station: Ballotable Exam by:: Dr. Henderson Cloud Blood pressure 149/95, pulse 83, temperature 99.2 F (37.3 C), temperature source  Oral, resp. rate 24, height 5\' 5"  (1.651 m), weight 120.203 kg (265 lb), last menstrual period 01/31/2011, SpO2 96.00%. Exam Physical Exam  Cardiovascular: Normal rate and regular rhythm.   Respiratory: Effort normal and breath sounds normal.  GI: There is tenderness.       Mildly tender at epigastrium and bilateral costal margins, no rebound  Neurological:       DTRs 2+ with 1-2 beats of clonus    Prenatal labs: ABO, Rh: A/Positive/-- (04/08 0000) Antibody: Negative (04/08 0000) Rubella: Immune (04/08 0000) RPR: Nonreactive (04/08 0000)  HBsAg: Negative (04/08 0000)  HIV: Non-reactive (04/08 0000)  GBS:     Assessment/Plan: 26 yo G2P0 at 1 0/7 weeks with preeclampsia based on increasingly labile BPs, new onset of HA with blurry vision unrelieved by narcotics, and epigastric pain. Reveiwed above with patient and husband. D/W varied presentations of preeclampsia/eclampsia.  D/W induction and risks of cesarean section, fetal distress, emergency cesarean section, failed induction.  D/W magnesium sulfate for seizure prophylaxis before and after delivery.  Reviewed options.  All questions answered.   Brandy Pierce,Brandy Pierce 10/24/2011, 5:13 PM

## 2011-10-24 NOTE — L&D Delivery Note (Signed)
Delivery Note  SVD viable female Apgars 7,8 over 2nd degree ML lac.  Placenta delivered spontaneously intact with 3VC. Repair with 2-0 Chromic with good support and hemostasis noted and R/V exam confirms.  PH art was sent.  [place] cord blood was not sent.  Mild atony responded to massage and meu.  Pt to AICU for PP magnesium. BPs now 120-130s/60-70s.  Mother and baby were doing well.  EBL 450  Candice Camp, MD

## 2011-10-25 MED ORDER — MISOPROSTOL 25 MCG QUARTER TABLET
25.0000 ug | ORAL_TABLET | ORAL | Status: DC | PRN
  Administered 2011-10-25 – 2011-10-27 (×6): 25 ug via VAGINAL
  Filled 2011-10-25 (×4): qty 0.25

## 2011-10-25 MED ORDER — OXYTOCIN 20 UNITS IN LACTATED RINGERS INFUSION - SIMPLE
1.0000 m[IU]/min | INTRAVENOUS | Status: DC
  Filled 2011-10-25: qty 1000

## 2011-10-25 MED ORDER — BUTORPHANOL TARTRATE 2 MG/ML IJ SOLN
1.0000 mg | Freq: Once | INTRAMUSCULAR | Status: AC
  Administered 2011-10-25: 1 mg via INTRAVENOUS

## 2011-10-25 MED ORDER — TERBUTALINE SULFATE 1 MG/ML IJ SOLN: 0.2500 mg | Freq: Once | INTRAMUSCULAR | Status: AC | PRN

## 2011-10-25 MED ORDER — OXYTOCIN 20 UNITS IN LACTATED RINGERS INFUSION - SIMPLE
1.0000 m[IU]/min | INTRAVENOUS | Status: DC
  Administered 2011-10-25: 2 m[IU]/min via INTRAVENOUS
  Filled 2011-10-25: qty 1000

## 2011-10-25 MED ORDER — TERBUTALINE SULFATE 1 MG/ML IJ SOLN: 0.2500 mg | Freq: Once | INTRAMUSCULAR | Status: DC | PRN

## 2011-10-25 MED ORDER — BUTORPHANOL TARTRATE 2 MG/ML IJ SOLN: 1.0000 mg | Freq: Once | INTRAMUSCULAR | Status: DC

## 2011-10-25 MED ORDER — PROMETHAZINE HCL 25 MG/ML IJ SOLN
12.5000 mg | Freq: Four times a day (QID) | INTRAMUSCULAR | Status: AC | PRN
  Administered 2011-10-25: 12.5 mg via INTRAVENOUS
  Filled 2011-10-25: qty 1

## 2011-10-25 MED ORDER — ZOLPIDEM TARTRATE 10 MG PO TABS
10.0000 mg | ORAL_TABLET | Freq: Every evening | ORAL | Status: DC | PRN
  Administered 2011-10-26 (×2): 10 mg via ORAL
  Filled 2011-10-25 (×2): qty 1

## 2011-10-25 NOTE — Progress Notes (Signed)
Patient ID: Brandy Pierce, female   DOB: 1986-04-06, 26 y.o.   MRN: [id number]  CX  FT/post/high.  She has received Cervidil>>pit per protocol now at 14 mU/min  On MgSo4 at 2 gm/hr and has received several doses of IV Labetolol for diastiolic >110.  Discussed several options with her now>>two day induction vs CS later today for failed induction.    Will discuss further this PM

## 2011-10-25 NOTE — Progress Notes (Signed)
Patient ID: Brandy Pierce, female   DOB: 05-22-1986, 26 y.o.   MRN: [id number]  Day 1 of induction.  Pitocin up to 62mU/ min, no change beyond 1 thick per RN exam. Pt desires second day labor induction

## 2011-10-25 NOTE — Progress Notes (Signed)
No change in cx, will D/C Cervidil>>pit per protocol.  On MgSo4 2gm/hr, rec'd second dose IV labetoilol for diastolic >110

## 2011-10-26 MED ORDER — MISOPROSTOL 25 MCG QUARTER TABLET
25.0000 ug | ORAL_TABLET | ORAL | Status: DC | PRN
  Filled 2011-10-26 (×2): qty 0.25

## 2011-10-26 MED ORDER — PROMETHAZINE HCL 25 MG/ML IJ SOLN
12.5000 mg | Freq: Four times a day (QID) | INTRAMUSCULAR | Status: AC | PRN
Start: 2011-10-26 — End: 2011-10-26
  Administered 2011-10-26: 12.5 mg via INTRAVENOUS
  Filled 2011-10-26 (×2): qty 1

## 2011-10-26 MED ORDER — TERBUTALINE SULFATE 1 MG/ML IJ SOLN: 0.2500 mg | Freq: Once | INTRAMUSCULAR | Status: AC | PRN

## 2011-10-26 MED ORDER — HYDRALAZINE HCL 20 MG/ML IJ SOLN
10.0000 mg | Freq: Once | INTRAMUSCULAR | Status: AC
  Administered 2011-10-26: 10 mg via INTRAVENOUS
  Filled 2011-10-26: qty 1

## 2011-10-26 MED ORDER — OXYTOCIN 20 UNITS IN LACTATED RINGERS INFUSION - SIMPLE
1.0000 m[IU]/min | INTRAVENOUS | Status: DC
  Administered 2011-10-26: 2 m[IU]/min via INTRAVENOUS

## 2011-10-26 NOTE — Progress Notes (Signed)
Patient ID: Brandy Pierce, female   DOB: [birth year], 26 y.o.   MRN: [id number] Up to 16 mu of pitocin cx with increased effacement to 80 %  Ft dilation fhr 140's  Flat with accels did receive stadol and phenergan bp's ok is one magnesium Discussed proceding with cesarean section. Declines would like to retry cytotec

## 2011-10-26 NOTE — Progress Notes (Signed)
Patient ID: Brandy Pierce, female   DOB: 08/13/1986, 26 y.o.   MRN: [id number] Cytotec last pm bp 146/80 vss Cervix 50% and closed FHR accels 140's no decel IMP: iup at 38.2 with PIH and Gest DM for induction PLAN: restart pitocin continue magnesium risk of pitocin discussed

## 2011-10-27 ENCOUNTER — Encounter (HOSPITAL_COMMUNITY): Payer: Self-pay | Admitting: Anesthesiology

## 2011-10-27 ENCOUNTER — Encounter (HOSPITAL_COMMUNITY): Payer: Self-pay | Admitting: *Deleted

## 2011-10-27 ENCOUNTER — Inpatient Hospital Stay (HOSPITAL_COMMUNITY): Payer: Medicaid Other | Admitting: Anesthesiology

## 2011-10-27 LAB — CBC
MCH: 27.4 pg (ref 26.0–34.0)
Platelets: 225 10*3/uL (ref 150–400)
Platelets: 252 10*3/uL (ref 150–400)
RBC: 3.97 MIL/uL (ref 3.87–5.11)
RBC: 3.54 MIL/uL — ABNORMAL LOW (ref 3.87–5.11)
WBC: 9.3 10*3/uL (ref 4.0–10.5)
WBC: 12.3 10*3/uL — ABNORMAL HIGH (ref 4.0–10.5)

## 2011-10-27 LAB — COMPREHENSIVE METABOLIC PANEL
Albumin: 2.5 g/dL — ABNORMAL LOW (ref 3.5–5.2)
BUN: 8 mg/dL (ref 6–23)
CO2: 24 mEq/L (ref 19–32)
Chloride: 103 mEq/L (ref 96–112)
Creatinine, Ser: 0.96 mg/dL (ref 0.50–1.10)
GFR calc Af Amer: >90 mL/min (ref >90)
GFR calc non Af Amer: 82 mL/min — ABNORMAL LOW (ref >90)
Glucose, Bld: 102 mg/dL — ABNORMAL HIGH (ref 70–99)
Total Bilirubin: 0.2 mg/dL — ABNORMAL LOW (ref 0.3–1.2)

## 2011-10-27 MED ORDER — DIBUCAINE 1 % RE OINT: 1.0000 "application " | TOPICAL_OINTMENT | RECTAL | Status: DC | PRN

## 2011-10-27 MED ORDER — BENZOCAINE-MENTHOL 20-0.5 % EX AERO
INHALATION_SPRAY | CUTANEOUS | Status: AC
  Filled 2011-10-27: qty 56

## 2011-10-27 MED ORDER — BENZOCAINE-MENTHOL 20-0.5 % EX AERO
1.0000 "application " | INHALATION_SPRAY | CUTANEOUS | Status: DC | PRN
  Administered 2011-10-27: 1 via TOPICAL

## 2011-10-27 MED ORDER — SENNOSIDES-DOCUSATE SODIUM 8.6-50 MG PO TABS
2.0000 | ORAL_TABLET | Freq: Every day | ORAL | Status: DC
  Administered 2011-10-28: 2 via ORAL

## 2011-10-27 MED ORDER — SIMETHICONE 80 MG PO CHEW: 80.0000 mg | CHEWABLE_TABLET | ORAL | Status: DC | PRN

## 2011-10-27 MED ORDER — LIDOCAINE HCL 1.5 % IJ SOLN
INTRAMUSCULAR | Status: DC | PRN
  Administered 2011-10-27: 2 mL via INTRADERMAL
  Administered 2011-10-27 (×2): 5 mL via INTRADERMAL

## 2011-10-27 MED ORDER — LANOLIN HYDROUS EX OINT: TOPICAL_OINTMENT | CUTANEOUS | Status: DC | PRN

## 2011-10-27 MED ORDER — IBUPROFEN 600 MG PO TABS
600.0000 mg | ORAL_TABLET | Freq: Four times a day (QID) | ORAL | Status: DC
  Administered 2011-10-28 – 2011-10-29 (×6): 600 mg via ORAL
  Filled 2011-10-27: qty 1
  Filled 2011-10-27: qty 2
  Filled 2011-10-27 (×3): qty 1

## 2011-10-27 MED ORDER — MEDROXYPROGESTERONE ACETATE 150 MG/ML IM SUSP: 150.0000 mg | INTRAMUSCULAR | Status: DC | PRN

## 2011-10-27 MED ORDER — MAGNESIUM SULFATE 40 G IN LACTATED RINGERS - SIMPLE
2.0000 g/h | INTRAVENOUS | Status: DC
  Filled 2011-10-27: qty 500

## 2011-10-27 MED ORDER — MEASLES, MUMPS & RUBELLA VAC ~~LOC~~ INJ: 0.5000 mL | INJECTION | Freq: Once | SUBCUTANEOUS | Status: DC

## 2011-10-27 MED ORDER — LACTATED RINGERS IV SOLN
INTRAVENOUS | Status: DC
  Administered 2011-10-28: 04:00:00 via INTRAVENOUS

## 2011-10-27 MED ORDER — EPHEDRINE 5 MG/ML INJ
10.0000 mg | INTRAVENOUS | Status: DC | PRN
  Filled 2011-10-27: qty 4

## 2011-10-27 MED ORDER — OXYTOCIN 20 UNITS IN LACTATED RINGERS INFUSION - SIMPLE
1.0000 m[IU]/min | INTRAVENOUS | Status: DC
  Administered 2011-10-27: 2 m[IU]/min via INTRAVENOUS

## 2011-10-27 MED ORDER — DIPHENHYDRAMINE HCL 50 MG/ML IJ SOLN: 12.5000 mg | INTRAMUSCULAR | Status: DC | PRN

## 2011-10-27 MED ORDER — FENTANYL 2.5 MCG/ML BUPIVACAINE 1/10 % EPIDURAL INFUSION (WH - ANES)
14.0000 mL/h | INTRAMUSCULAR | Status: DC
  Administered 2011-10-27 (×4): 14 mL/h via EPIDURAL
  Filled 2011-10-27 (×4): qty 60

## 2011-10-27 MED ORDER — DIPHENHYDRAMINE HCL 25 MG PO CAPS: 25.0000 mg | ORAL_CAPSULE | Freq: Four times a day (QID) | ORAL | Status: DC | PRN

## 2011-10-27 MED ORDER — TETANUS-DIPHTH-ACELL PERTUSSIS 5-2.5-18.5 LF-MCG/0.5 IM SUSP
0.5000 mL | Freq: Once | INTRAMUSCULAR | Status: DC
  Filled 2011-10-27: qty 0.5

## 2011-10-27 MED ORDER — MAGNESIUM SULFATE 40 MG/ML IJ SOLN: 2.0000 g | Freq: Once | INTRAMUSCULAR | Status: DC

## 2011-10-27 MED ORDER — PHENYLEPHRINE 40 MCG/ML (10ML) SYRINGE FOR IV PUSH (FOR BLOOD PRESSURE SUPPORT): 80.0000 ug | PREFILLED_SYRINGE | INTRAVENOUS | Status: DC | PRN

## 2011-10-27 MED ORDER — EPHEDRINE 5 MG/ML INJ: 10.0000 mg | INTRAVENOUS | Status: DC | PRN

## 2011-10-27 MED ORDER — ONDANSETRON HCL 4 MG PO TABS: 4.0000 mg | ORAL_TABLET | ORAL | Status: DC | PRN

## 2011-10-27 MED ORDER — BUPIVACAINE HCL (PF) 0.25 % IJ SOLN
INTRAMUSCULAR | Status: DC | PRN
  Administered 2011-10-27: 5 mL via EPIDURAL

## 2011-10-27 MED ORDER — TERBUTALINE SULFATE 1 MG/ML IJ SOLN: 0.2500 mg | Freq: Once | INTRAMUSCULAR | Status: DC | PRN

## 2011-10-27 MED ORDER — ZOLPIDEM TARTRATE 10 MG PO TABS: 10.0000 mg | ORAL_TABLET | Freq: Every evening | ORAL | Status: DC | PRN

## 2011-10-27 MED ORDER — PHENYLEPHRINE 40 MCG/ML (10ML) SYRINGE FOR IV PUSH (FOR BLOOD PRESSURE SUPPORT)
80.0000 ug | PREFILLED_SYRINGE | INTRAVENOUS | Status: DC | PRN
  Filled 2011-10-27: qty 5

## 2011-10-27 MED ORDER — ZOLPIDEM TARTRATE 5 MG PO TABS
5.0000 mg | ORAL_TABLET | Freq: Every evening | ORAL | Status: DC | PRN
  Administered 2011-10-28: 5 mg via ORAL
  Filled 2011-10-27 (×2): qty 1

## 2011-10-27 MED ORDER — ONDANSETRON HCL 4 MG/2ML IJ SOLN: 4.0000 mg | INTRAMUSCULAR | Status: DC | PRN

## 2011-10-27 MED ORDER — WITCH HAZEL-GLYCERIN EX PADS: 1.0000 "application " | MEDICATED_PAD | CUTANEOUS | Status: DC | PRN

## 2011-10-27 MED ORDER — PRENATAL MULTIVITAMIN CH
1.0000 | ORAL_TABLET | Freq: Every day | ORAL | Status: DC
  Administered 2011-10-28 – 2011-10-29 (×2): 1 via ORAL
  Filled 2011-10-27 (×2): qty 1

## 2011-10-27 MED ORDER — LACTATED RINGERS IV SOLN
500.0000 mL | Freq: Once | INTRAVENOUS | Status: AC
  Administered 2011-10-27: 500 mL via INTRAVENOUS

## 2011-10-27 MED ORDER — OXYCODONE-ACETAMINOPHEN 5-325 MG PO TABS
1.0000 | ORAL_TABLET | ORAL | Status: DC | PRN
  Administered 2011-10-27 – 2011-10-28 (×6): 2 via ORAL
  Administered 2011-10-29: 1 via ORAL
  Filled 2011-10-27 (×7): qty 2
  Filled 2011-10-27: qty 1

## 2011-10-27 NOTE — Progress Notes (Signed)
Dr. Rana Snare notified of pt status, fundal and bleeding assessment, and maternal BP. Orders received. Will continue to monitor.

## 2011-10-27 NOTE — Anesthesia Preprocedure Evaluation (Signed)
Anesthesia Evaluation  Patient identified by MRN, date of birth, ID band Patient awake    Reviewed: Allergy & Precautions, H&P , NPO status , Patient's Chart, lab work & pertinent test results, reviewed documented beta blocker date and time   History of Anesthesia Complications Negative for: history of anesthetic complications  Airway Mallampati: III TM Distance: >3 FB Neck ROM: full    Dental  (+) Teeth Intact   Pulmonary neg pulmonary ROS,  clear to auscultation        Cardiovascular hypertension (preeclampsia on magnesium), regular Normal    Neuro/Psych Negative Neurological ROS  Negative Psych ROS   GI/Hepatic negative GI ROS, Neg liver ROS,   Endo/Other  Diabetes mellitus-, GestationalHypothyroidism Morbid obesityPCOS  Renal/GU negative Renal ROS  Genitourinary negative   Musculoskeletal   Abdominal   Peds  Hematology negative hematology ROS (+)   Anesthesia Other Findings   Reproductive/Obstetrics (+) Pregnancy                           Anesthesia Physical Anesthesia Plan  ASA: III  Anesthesia Plan: Epidural   Post-op Pain Management:    Induction:   Airway Management Planned:   Additional Equipment:   Intra-op Plan:   Post-operative Plan:   Informed Consent: I have reviewed the patients History and Physical, chart, labs and discussed the procedure including the risks, benefits and alternatives for the proposed anesthesia with the patient or authorized representative who has indicated his/her understanding and acceptance.     Plan Discussed with:   Anesthesia Plan Comments:         Anesthesia Quick Evaluation

## 2011-10-27 NOTE — Procedures (Signed)
Pt without complaints. No HA, scotomata Ctxs mild BPs elevated and controlled with prn labetolol Cx 2/90/-2 AROM clear DTRs 1/4  IUP at 38 + weeks PIH - continue Mg and pitocin.  Anticipate svd today.  Labs stable

## 2011-10-27 NOTE — Progress Notes (Addendum)
New admit - pt rec'd via wheelchair from [facility] following  SVD [redacted] wk gestation  viable female infant, 1/4 @ 64.  Pt on Magnesium Sulfate therapy  - PIH.   Ambulatory - transferred easily to bed with no c/o.  Steady gait - has voided in BS.  In bed, SR up x 2, call light at bs.  Admitting weight obt.  Husband, baby acc pt.  Family present.  Oriented to room, POC briefly discussed.   MRSA nasal swab obtained.

## 2011-10-27 NOTE — Anesthesia Procedure Notes (Signed)
Epidural Patient location during procedure: OB Start time: 10/27/2011 9:30 AM Reason for block: procedure for pain  Staffing Performed by: anesthesiologist   Preanesthetic Checklist Completed: patient identified, site marked, surgical consent, pre-op evaluation, timeout performed, IV checked, risks and benefits discussed and monitors and equipment checked  Epidural Patient position: sitting Prep: site prepped and draped and DuraPrep Patient monitoring: continuous pulse ox and blood pressure Approach: midline Injection technique: LOR air  Needle:  Needle type: Tuohy  Needle gauge: 17 G Needle length: 9 cm Needle insertion depth: 7 cm Catheter type: closed end flexible Catheter size: 19 Gauge Catheter at skin depth: 12 cm Test dose: negative  Assessment Events: blood not aspirated, injection not painful, no injection resistance, negative IV test and no paresthesia  Additional Notes Discussed risk of headache, infection, bleeding, nerve injury and failed or incomplete block.  Patient voices understanding and wishes to proceed.

## 2011-10-28 LAB — COMPREHENSIVE METABOLIC PANEL
ALT: 13 U/L (ref 0–35)
AST: 23 U/L (ref 0–37)
CO2: 23 mEq/L (ref 19–32)
Calcium: 8.1 mg/dL — ABNORMAL LOW (ref 8.4–10.5)
GFR calc non Af Amer: 54 mL/min — ABNORMAL LOW (ref >90)
Potassium: 3.9 mEq/L (ref 3.5–5.1)
Sodium: 135 mEq/L (ref 135–145)

## 2011-10-28 LAB — CBC
HCT: 22.1 % — ABNORMAL LOW (ref 36.0–46.0)
Hemoglobin: 7.5 g/dL — ABNORMAL LOW (ref 12.0–15.0)
MCH: 27.9 pg (ref 26.0–34.0)
MCHC: 33.9 g/dL (ref 30.0–36.0)

## 2011-10-28 LAB — RPR: RPR Ser Ql: NONREACTIVE

## 2011-10-28 NOTE — Anesthesia Postprocedure Evaluation (Signed)
Anesthesia Post Note  Patient: Brandy Pierce  Procedure(s) Performed: * No procedures listed *  Anesthesia type: Epidural  Patient location: Mother/Baby  Post pain: Pain level controlled  Post assessment: Post-op Vital signs reviewed  Last Vitals:  Filed Vitals:   10/28/11 1208  BP:   Pulse:   Temp: 37.1 C  Resp:     Post vital signs: Reviewed  Level of consciousness: awake  Complications: No apparent anesthesia complications

## 2011-10-28 NOTE — Progress Notes (Addendum)
10/28/11 1245 SBar telephone report called to [facility], RN for pt transfer to [facility] #[id number]             [phone] Pt tranferred via w/c to rm#[id number]

## 2011-10-28 NOTE — Progress Notes (Signed)
Post Partum Day 1 Subjective: no complaints and tolerating PO  Objective: Blood pressure 143/79, pulse 86, temperature 97.9 F (36.6 C), temperature source Oral, resp. rate 22, height 5\' 4"  (1.626 m), weight 122.38 kg (269 lb 12.8 oz), last menstrual period 01/31/2011, SpO2 97.00%, unknown if currently breastfeeding.  Physical Exam:  General: alert, cooperative and mild distress Lochia: appropriate Uterine Fundus: firm Incision:  DVT Evaluation: No evidence of DVT seen on physical exam.   Basename 10/28/11 0508 10/27/11 2015  HGB 7.5* 9.7*  HCT 22.1* 29.1*    Assessment/Plan: Plan for discharge tomorrow PIH stable BPs and labs DC Mg and transfer to floor   LOS: 4 days   Kenasia Scheller C 10/28/2011, 8:14 AM

## 2011-10-29 LAB — CBC
HCT: 21 % — ABNORMAL LOW (ref 36.0–46.0)
MCHC: 33.3 g/dL (ref 30.0–36.0)
MCV: 82.4 fL (ref 78.0–100.0)
Platelets: 202 10*3/uL (ref 150–400)
RDW: 13.9 % (ref 11.5–15.5)

## 2011-10-29 LAB — COMPREHENSIVE METABOLIC PANEL
BUN: 13 mg/dL (ref 6–23)
CO2: 24 mEq/L (ref 19–32)
Calcium: 8.4 mg/dL (ref 8.4–10.5)
Creatinine, Ser: 1.1 mg/dL (ref 0.50–1.10)
GFR calc Af Amer: 81 mL/min — ABNORMAL LOW (ref >90)
GFR calc non Af Amer: 70 mL/min — ABNORMAL LOW (ref >90)
Glucose, Bld: 82 mg/dL (ref 70–99)

## 2011-10-29 MED ORDER — OXYCODONE-ACETAMINOPHEN 5-500 MG PO CAPS: 1.0000 | ORAL_CAPSULE | ORAL | Status: AC | PRN

## 2011-10-29 NOTE — Progress Notes (Signed)
Post Partum Day 2 Subjective: no complaints, up ad lib and tolerating PO  Objective: Blood pressure 145/79, pulse 92, temperature 98.1 F (36.7 C), temperature source Oral, resp. rate 18, height 5\' 4"  (1.626 m), weight 124.739 kg (275 lb), last menstrual period 01/31/2011, SpO2 98.00%, unknown if currently breastfeeding.  Physical Exam:  General: alert, cooperative and mild distress Lochia: appropriate Uterine Fundus: firm Incision:  DVT Evaluation: No evidence of DVT seen on physical exam.   Basename 10/29/11 0538 10/28/11 0508  HGB 7.0* 7.5*  HCT 21.0* 22.1*    Assessment/Plan: Discharge home Feso4 and PNV Tylox FU 6 weeks   LOS: 5 days   Sacred Roa C 10/29/2011, 9:06 AM

## 2011-10-30 NOTE — Discharge Summary (Signed)
Obstetric Discharge Summary Reason for Admission: onset of labor Prenatal Procedures: ultrasound Intrapartum Procedures: spontaneous vaginal delivery Postpartum Procedures: none Complications-Operative and Postpartum: 2 degree perineal laceration Hemoglobin  Date Value Range Status  10/29/2011 7.0* 12.0-15.0 (g/dL) Final     HCT  Date Value Range Status  10/29/2011 21.0* 36.0-46.0 (%) Final    Discharge Diagnoses: Term Pregnancy-delivered  Discharge Information: Date: 10/30/2011 Activity: pelvic rest Diet: routine Medications: PNV, Ibuprofen and Percocet Condition: stable Instructions: refer to practice specific booklet Discharge to: home   Newborn Data: Live born female  Birth Weight: 8 lb 10 oz (3912 g) APGAR: 7, 8  Home with mother.  Doran Nestle G 10/30/2011, 8:27 AM

## 2011-10-30 NOTE — Progress Notes (Signed)
Post discharge chart review completed.  

## 2014-08-24 ENCOUNTER — Encounter (HOSPITAL_COMMUNITY): Payer: Self-pay | Admitting: *Deleted

## 2015-10-19 ENCOUNTER — Encounter (HOSPITAL_COMMUNITY): Payer: Self-pay | Admitting: Vascular Surgery

## 2015-10-19 ENCOUNTER — Emergency Department (HOSPITAL_COMMUNITY)
Admission: EM | Admit: 2015-10-19 | Discharge: 2015-10-20 | Disposition: A | Discharge status: Home / Self Care | Payer: BLUE CROSS/BLUE SHIELD | Attending: Emergency Medicine | Admitting: Emergency Medicine

## 2015-10-19 ENCOUNTER — Emergency Department (HOSPITAL_COMMUNITY): Payer: BLUE CROSS/BLUE SHIELD

## 2015-10-19 DIAGNOSIS — Z3202 Encounter for pregnancy test, result negative: Secondary | ICD-10-CM | POA: Insufficient documentation

## 2015-10-19 DIAGNOSIS — R079 Chest pain, unspecified: Secondary | ICD-10-CM | POA: Diagnosis present

## 2015-10-19 DIAGNOSIS — E669 Obesity, unspecified: Secondary | ICD-10-CM | POA: Insufficient documentation

## 2015-10-19 DIAGNOSIS — I10 Essential (primary) hypertension: Secondary | ICD-10-CM | POA: Insufficient documentation

## 2015-10-19 DIAGNOSIS — R61 Generalized hyperhidrosis: Secondary | ICD-10-CM | POA: Insufficient documentation

## 2015-10-19 DIAGNOSIS — Z79899 Other long term (current) drug therapy: Secondary | ICD-10-CM | POA: Diagnosis not present

## 2015-10-19 DIAGNOSIS — Z8632 Personal history of gestational diabetes: Secondary | ICD-10-CM | POA: Diagnosis not present

## 2015-10-19 HISTORY — DX: Essential (primary) hypertension: I10

## 2015-10-19 LAB — CBC
HCT: 41.6 % (ref 36.0–46.0)
Hemoglobin: 14.5 g/dL (ref 12.0–15.0)
MCH: 30.4 pg (ref 26.0–34.0)
MCHC: 34.9 g/dL (ref 30.0–36.0)
MCV: 87.2 fL (ref 78.0–100.0)
PLATELETS: 287 10*3/uL (ref 150–400)
RBC: 4.77 MIL/uL (ref 3.87–5.11)
RDW: 12 % (ref 11.5–15.5)
WBC: 8.2 10*3/uL (ref 4.0–10.5)

## 2015-10-19 LAB — BASIC METABOLIC PANEL
ANION GAP: 10 (ref 5–15)
BUN: 15 mg/dL (ref 6–20)
CALCIUM: 9.4 mg/dL (ref 8.9–10.3)
CO2: 22 mmol/L (ref 22–32)
Chloride: 107 mmol/L (ref 101–111)
Creatinine, Ser: 1.02 mg/dL — ABNORMAL HIGH (ref 0.44–1.00)
GFR calc Af Amer: >60 mL/min (ref >60)
GLUCOSE: 106 mg/dL — AB (ref 65–99)
Potassium: 3.6 mmol/L (ref 3.5–5.1)
SODIUM: 139 mmol/L (ref 135–145)

## 2015-10-19 LAB — I-STAT TROPONIN, ED: TROPONIN I, POC: 0.03 ng/mL (ref 0.00–0.08)

## 2015-10-19 MED ORDER — GI COCKTAIL ~~LOC~~
30.0000 mL | Freq: Once | ORAL | Status: AC
  Administered 2015-10-19: 30 mL via ORAL
  Filled 2015-10-19: qty 30

## 2015-10-19 NOTE — ED Notes (Signed)
Pt reports to the ED for eval of CP and pressure that began approx 10 mins ago. She reports associated symptoms of SOB and diaphoresis. Localizes the pain to the middle of her chest. Pt clearly anxious. She is A&Ox4, resp e/u, and skin warm and moist.

## 2015-10-19 NOTE — ED Provider Notes (Signed)
CSN: [id number]     Arrival date & time 10/19/15  1851 History   By signing my name below, I, Brandy Pierce, attest that this documentation has been prepared under the direction and in the presence of Leta BaptistEmily Roe Nguyen, MD.  Electronically Signed: Arlan OrganAshley Pierce, ED Scribe. 10/19/2015. 11:42 PM.   Chief Complaint  Patient presents with  . Chest Pain   The history is provided by the patient. No language interpreter was used.    HPI Comments: Brandy Pierce is a 29 y.o. female with a PMHx of HTN, anxiety who presents to the Emergency Department complaining of constant, ongoing chest pain onset 10 minutes prior to arrival. Pain is described as pressure and burning pain. Currently she is pain free after drinking a ginger ale in the waiting room and burping. Pt feels as though symptoms may have been attributed to heartburn as she has been eating unhealthy the last week. Pt states at time of episode, she also experienced diaphoresis and shortness of breath. Discomfort was exacerbated when flat and mildly alleviated after burping. No OTC medications or home remedies attempted prior to arrival. No recent fever, chills, nausea, or vomiting.  No leg swelling.  Not on OCP.  PCP: Turner DanielsLOWE,DAVID C, MD    Past Medical History  Diagnosis Date  . Polycystic disease, ovaries   . Hypothyroidism   . Gestational diabetes   . Obesity   . Hypertension    Past Surgical History  Procedure Laterality Date  . No past surgeries     Family History  Problem Relation Age of Onset  . Anesthesia problems Neg Hx    Social History  Substance Use Topics  . Smoking status: Never Smoker   . Smokeless tobacco: Never Used  . Alcohol Use: No   OB History    Gravida Para Term Preterm AB TAB SAB Ectopic Multiple Living   2 1 1  1 1    1      Review of Systems  Constitutional: Positive for diaphoresis. Negative for fever, chills and fatigue.  HENT: Negative for congestion, rhinorrhea and sinus pressure.   Eyes:  Negative for pain and redness.  Respiratory: Negative for cough, chest tightness and shortness of breath.   Cardiovascular: Positive for chest pain. Negative for palpitations and leg swelling.  Gastrointestinal: Negative for nausea, vomiting, abdominal pain and diarrhea.  Genitourinary: Negative for dysuria, urgency and hematuria.  Musculoskeletal: Negative for myalgias and back pain.  Skin: Negative for rash.  Neurological: Negative for dizziness, weakness and numbness.  Hematological: Does not bruise/bleed easily.    A complete 10 system review of systems was obtained and all systems are negative except as noted in the HPI and PMH.    Allergies  Review of patient's allergies indicates no known allergies.  Home Medications   Prior to Admission medications   Medication Sig Start Date End Date Taking? Authorizing Provider  omeprazole (PRILOSEC) 20 MG capsule Take 1 capsule (20 mg total) by mouth daily. 10/20/15   Leta BaptistEmily Roe Nguyen, MD  prenatal vitamin w/FE, FA (PRENATAL 1 + 1) 27-1 MG TABS Take 1 tablet by mouth at bedtime.     Historical Provider, MD   Triage Vitals: BP 153/80 mmHg  Pulse 84  Temp(Src) 98.4 F (36.9 C) (Oral)  Resp 22  SpO2 99%  LMP 09/18/2015   Physical Exam  Constitutional: She is oriented to person, place, and time. She appears well-developed and well-nourished. No distress.  HENT:  Head: Normocephalic and atraumatic.  Right Ear: External ear normal.  Left Ear: External ear normal.  Nose: Nose normal.  Mouth/Throat: Oropharynx is clear and moist. No oropharyngeal exudate.  Eyes: EOM are normal. Pupils are equal, round, and reactive to light.  Neck: Normal range of motion. Neck supple.  Cardiovascular: Normal rate, regular rhythm, normal heart sounds and intact distal pulses.   No murmur heard. Pulmonary/Chest: Effort normal. No respiratory distress. She has no wheezes. She has no rales.  Abdominal: Soft. She exhibits no distension. There is no  tenderness.  Musculoskeletal: Normal range of motion. She exhibits no edema or tenderness.  Neurological: She is alert and oriented to person, place, and time.  Skin: Skin is warm and dry. No rash noted. She is not diaphoretic.  Vitals reviewed.   ED Course  Procedures (including critical care time)  DIAGNOSTIC STUDIES: Oxygen Saturation is 100% on RA, Normal by my interpretation.    COORDINATION OF CARE: 11:28 PM- Will order CXR, i-stat beta hCG blood, BMP, i-stat troponin, and EKG. Discussed treatment plan with pt at bedside and pt agreed to plan.     Labs Review Labs Reviewed  BASIC METABOLIC PANEL - Abnormal; Notable for the following:    Glucose, Bld 106 (*)    Creatinine, Ser 1.02 (*)    All other components within normal limits  CBC  I-STAT TROPOININ, ED  I-STAT BETA HCG BLOOD, ED ([facility], [facility], [facility] ONLY)    Imaging Review Dg Chest 2 View  10/19/2015  CLINICAL DATA:  Left-sided chest pain and shortness of Breath EXAM: CHEST - 2 VIEW COMPARISON:  None. FINDINGS: The heart size and mediastinal contours are within normal limits. Both lungs are clear. The visualized skeletal structures are unremarkable. IMPRESSION: No active disease. Electronically Signed   By: Alcide Clever M.D.   On: 10/19/2015 20:20   I have personally reviewed and evaluated these images and lab results as part of my medical decision-making.   EKG Interpretation   Date/Time:  Tuesday October 19 2015 18:55:13 EST Ventricular Rate:  89 PR Interval:  140 QRS Duration: 86 QT Interval:  340 QTC Calculation: 413 R Axis:   81 Text Interpretation:  Normal sinus rhythm Normal ECG No previous ECGs  available Confirmed by NGUYEN, EMILY ([id number]) on 10/19/2015 11:32:36 PM      MDM  Patient was seen and evaluated in stable condition.  Presentation seems most likely consistent with GERD/gas pain.  Patient currently feels improved.  Patient with unremarkable labs, chest xray, ekg.  Patient feeling better.  Patient  Wells low risk and PERC negative.  Patient was discharged home in stable condition with prescription for Omeprazole and instruction to follow up outpatient for reevaluation. Final diagnoses:  Chest pain, unspecified chest pain type    1. Chest pain, likely GERD  I personally performed the services described in this documentation, which was scribed in my presence. The recorded information has been reviewed and is accurate.  Leta Baptist, MD 10/20/15 [place]uns

## 2015-10-20 LAB — I-STAT BETA HCG BLOOD, ED (MC, WL, AP ONLY)

## 2015-10-20 MED ORDER — OMEPRAZOLE 20 MG PO CPDR: 20.0000 mg | DELAYED_RELEASE_CAPSULE | Freq: Every day | ORAL | Status: DC

## 2015-10-20 NOTE — Discharge Instructions (Signed)
You were seen today for your chest pain.  This seems like it is most likely related to reflux/gas pain.  Take the medication prescribed as well as over the counter antacids as needed.  Follow up outpatient for reevaluation especially if symptoms persist.  Gastroesophageal Reflux Disease, Adult Normally, food travels down the esophagus and stays in the stomach to be digested. However, when a person has gastroesophageal reflux disease (GERD), food and stomach acid move back up into the esophagus. When this happens, the esophagus becomes sore and inflamed. Over time, GERD can create small holes (ulcers) in the lining of the esophagus.  CAUSES This condition is caused by a problem with the muscle between the esophagus and the stomach (lower esophageal sphincter, or LES). Normally, the LES muscle closes after food passes through the esophagus to the stomach. When the LES is weakened or abnormal, it does not close properly, and that allows food and stomach acid to go back up into the esophagus. The LES can be weakened by certain dietary substances, medicines, and medical conditions, including:  Tobacco use.  Pregnancy.  Having a hiatal hernia.  Heavy alcohol use.  Certain foods and beverages, such as coffee, chocolate, onions, and peppermint. RISK FACTORS This condition is more likely to develop in:  People who have an increased body weight.  People who have connective tissue disorders.  People who use NSAID medicines. SYMPTOMS Symptoms of this condition include:  Heartburn.  Difficult or painful swallowing.  The feeling of having a lump in the throat.  Abitter taste in the mouth.  Bad breath.  Having a large amount of saliva.  Having an upset or bloated stomach.  Belching.  Chest pain.  Shortness of breath or wheezing.  Ongoing (chronic) cough or a night-time cough.  Wearing away of tooth enamel.  Weight loss. Different conditions can cause chest pain. Make sure to see  your health care provider if you experience chest pain. DIAGNOSIS Your health care provider will take a medical history and perform a physical exam. To determine if you have mild or severe GERD, your health care provider may also monitor how you respond to treatment. You may also have other tests, including:  An endoscopy toexamine your stomach and esophagus with a small camera.  A test thatmeasures the acidity level in your esophagus.  A test thatmeasures how much pressure is on your esophagus.  A barium swallow or modified barium swallow to show the shape, size, and functioning of your esophagus. TREATMENT The goal of treatment is to help relieve your symptoms and to prevent complications. Treatment for this condition may vary depending on how severe your symptoms are. Your health care provider may recommend:  Changes to your diet.  Medicine.  Surgery. HOME CARE INSTRUCTIONS Diet  Follow a diet as recommended by your health care provider. This may involve avoiding foods and drinks such as:  Coffee and tea (with or without caffeine).  Drinks that containalcohol.  Energy drinks and sports drinks.  Carbonated drinks or sodas.  Chocolate and cocoa.  Peppermint and mint flavorings.  Garlic and onions.  Horseradish.  Spicy and acidic foods, including peppers, chili powder, curry powder, vinegar, hot sauces, and barbecue sauce.  Citrus fruit juices and citrus fruits, such as oranges, lemons, and limes.  Tomato-based foods, such as red sauce, chili, salsa, and pizza with red sauce.  Fried and fatty foods, such as donuts, french fries, potato chips, and high-fat dressings.  High-fat meats, such as hot dogs and fatty  cuts of red and white meats, such as rib eye steak, sausage, ham, and bacon.  High-fat dairy items, such as whole milk, butter, and cream cheese.  Eat small, frequent meals instead of large meals.  Avoid drinking large amounts of liquid with your  meals.  Avoid eating meals during the 2-3 hours before bedtime.  Avoid lying down right after you eat.  Do not exercise right after you eat. General Instructions  Pay attention to any changes in your symptoms.  Take over-the-counter and prescription medicines only as told by your health care provider. Do not take aspirin, ibuprofen, or other NSAIDs unless your health care provider told you to do so.  Do not use any tobacco products, including cigarettes, chewing tobacco, and e-cigarettes. If you need help quitting, ask your health care provider.  Wear loose-fitting clothing. Do not wear anything tight around your waist that causes pressure on your abdomen.  Raise (elevate) the head of your bed 6 inches (15cm).  Try to reduce your stress, such as with yoga or meditation. If you need help reducing stress, ask your health care provider.  If you are overweight, reduce your weight to an amount that is healthy for you. Ask your health care provider for guidance about a safe weight loss goal.  Keep all follow-up visits as told by your health care provider. This is important. SEEK MEDICAL CARE IF:  You have new symptoms.  You have unexplained weight loss.  You have difficulty swallowing, or it hurts to swallow.  You have wheezing or a persistent cough.  Your symptoms do not improve with treatment.  You have a hoarse voice. SEEK IMMEDIATE MEDICAL CARE IF:  You have pain in your arms, neck, jaw, teeth, or back.  You feel sweaty, dizzy, or light-headed.  You have chest pain or shortness of breath.  You vomit and your vomit looks like blood or coffee grounds.  You faint.  Your stool is bloody or black.  You cannot swallow, drink, or eat.   This information is not intended to replace advice given to you by your health care provider. Make sure you discuss any questions you have with your health care provider.   Document Released: 07/19/2005 Document Revised: 06/30/2015  Document Reviewed: 02/03/2015 Elsevier Interactive Patient Education [employer]2016 Elsevier Inc.

## 2015-12-31 ENCOUNTER — Ambulatory Visit: Payer: BLUE CROSS/BLUE SHIELD | Admitting: Family Medicine

## 2015-12-31 ENCOUNTER — Encounter: Payer: Self-pay | Admitting: Family Medicine

## 2015-12-31 ENCOUNTER — Ambulatory Visit (INDEPENDENT_AMBULATORY_CARE_PROVIDER_SITE_OTHER): Payer: Managed Care, Other (non HMO) | Admitting: Family Medicine

## 2015-12-31 VITALS — BP 146/95 | HR 76 | Temp 98.9°F | Resp 20 | Ht 66.0 in | Wt 257.0 lb

## 2015-12-31 DIAGNOSIS — R030 Elevated blood-pressure reading, without diagnosis of hypertension: Secondary | ICD-10-CM | POA: Diagnosis not present

## 2015-12-31 DIAGNOSIS — Z7689 Persons encountering health services in other specified circumstances: Secondary | ICD-10-CM

## 2015-12-31 DIAGNOSIS — Z0000 Encounter for general adult medical examination without abnormal findings: Secondary | ICD-10-CM

## 2015-12-31 DIAGNOSIS — E039 Hypothyroidism, unspecified: Secondary | ICD-10-CM

## 2015-12-31 DIAGNOSIS — Z7189 Other specified counseling: Secondary | ICD-10-CM

## 2015-12-31 DIAGNOSIS — Z91410 Personal history of adult physical and sexual abuse: Secondary | ICD-10-CM | POA: Diagnosis not present

## 2015-12-31 DIAGNOSIS — IMO0001 Reserved for inherently not codable concepts without codable children: Secondary | ICD-10-CM

## 2015-12-31 NOTE — Patient Instructions (Signed)
Have your thyroid panel collected at [facility] office on or after 4/21. Make certain to schedule an appt her to follow 2 days after you have made your lab appt.  Try the Miralax 1/4 cap daily with 8 ounces of water to help with sluggish BM. It was a pleasure meeting you today.
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
## 2016-01-03 ENCOUNTER — Encounter: Payer: Self-pay | Admitting: Family Medicine

## 2016-01-03 DIAGNOSIS — IMO0001 Reserved for inherently not codable concepts without codable children: Secondary | ICD-10-CM | POA: Insufficient documentation

## 2016-01-03 DIAGNOSIS — Z91410 Personal history of adult physical and sexual abuse: Secondary | ICD-10-CM | POA: Insufficient documentation

## 2016-01-03 DIAGNOSIS — Z0000 Encounter for general adult medical examination without abnormal findings: Secondary | ICD-10-CM | POA: Insufficient documentation

## 2016-01-03 DIAGNOSIS — R030 Elevated blood-pressure reading, without diagnosis of hypertension: Secondary | ICD-10-CM

## 2016-02-11 ENCOUNTER — Other Ambulatory Visit (INDEPENDENT_AMBULATORY_CARE_PROVIDER_SITE_OTHER): Payer: Managed Care, Other (non HMO)

## 2016-02-11 ENCOUNTER — Telehealth: Payer: Self-pay | Admitting: Family Medicine

## 2016-02-11 DIAGNOSIS — E039 Hypothyroidism, unspecified: Secondary | ICD-10-CM | POA: Diagnosis not present

## 2016-02-11 LAB — TSH: TSH: 45.62 u[IU]/mL — AB (ref 0.35–4.50)

## 2016-02-11 LAB — T4, FREE: FREE T4: 0.67 ng/dL (ref 0.60–1.60)

## 2016-02-11 MED ORDER — LEVOTHYROXINE SODIUM 100 MCG PO TABS: 100.0000 ug | ORAL_TABLET | Freq: Every day | ORAL | Status: DC

## 2016-02-11 NOTE — Telephone Encounter (Signed)
Spoke with patient reviewed lab results and instructions with patient. 

## 2016-02-11 NOTE — Telephone Encounter (Signed)
Please call patient, I see she has an appointment scheduled week for follow-up. I have received her laboratory results and her thyroid levels are off again. We will discuss this in detail during her office visit. I have went ahead and changed her dose so that she can get started immediately on a higher dose of Synthroid. We will need to keep her follow-up appointment.

## 2016-02-16 ENCOUNTER — Encounter: Payer: Self-pay | Admitting: Family Medicine

## 2016-02-16 ENCOUNTER — Ambulatory Visit (INDEPENDENT_AMBULATORY_CARE_PROVIDER_SITE_OTHER): Payer: Managed Care, Other (non HMO) | Admitting: Family Medicine

## 2016-02-16 VITALS — BP 132/88 | HR 92 | Temp 98.2°F | Resp 20 | Wt 261.8 lb

## 2016-02-16 DIAGNOSIS — E039 Hypothyroidism, unspecified: Secondary | ICD-10-CM

## 2016-02-16 DIAGNOSIS — Z6841 Body Mass Index (BMI) 40.0 and over, adult: Secondary | ICD-10-CM | POA: Diagnosis not present

## 2016-02-16 DIAGNOSIS — IMO0001 Reserved for inherently not codable concepts without codable children: Secondary | ICD-10-CM

## 2016-02-16 DIAGNOSIS — R030 Elevated blood-pressure reading, without diagnosis of hypertension: Secondary | ICD-10-CM | POA: Diagnosis not present
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
## 2016-02-16 NOTE — Patient Instructions (Signed)
Hypothyroidism Hypothyroidism is a disorder of the thyroid. The thyroid is a large gland that is located in the lower front of the neck. The thyroid releases hormones that control how the body works. With hypothyroidism, the thyroid does not make enough of these hormones. CAUSES Causes of hypothyroidism may include:  Viral infections.  Pregnancy.  Your own defense system (immune system) attacking your thyroid.  Certain medicines.  Birth defects.  Past radiation treatments to your head or neck.  Past treatment with radioactive iodine.  Past surgical removal of part or all of your thyroid.  Problems with the gland that is located in the center of your brain (pituitary). SIGNS AND SYMPTOMS Signs and symptoms of hypothyroidism may include:  Feeling as though you have no energy (lethargy).  Inability to tolerate cold.  Weight gain that is not explained by a change in diet or exercise habits.  Dry skin.  Coarse hair.  Menstrual irregularity.  Slowing of thought processes.  Constipation.  Sadness or depression. DIAGNOSIS  Your health care provider may diagnose hypothyroidism with blood tests and ultrasound tests. TREATMENT Hypothyroidism is treated with medicine that replaces the hormones that your body does not make. After you begin treatment, it may take several weeks for symptoms to go away. HOME CARE INSTRUCTIONS   Take medicines only as directed by your health care provider.  If you start taking any new medicines, tell your health care provider.  Keep all follow-up visits as directed by your health care provider. This is important. As your condition improves, your dosage needs may change. You will need to have blood tests regularly so that your health care provider can watch your condition. SEEK MEDICAL CARE IF:  Your symptoms do not get better with treatment.  You are taking thyroid replacement medicine and:  You sweat excessively.  You have tremors.  You  feel anxious.  You lose weight rapidly.  You cannot tolerate heat.  You have emotional swings.  You have diarrhea.  You feel weak. SEEK IMMEDIATE MEDICAL CARE IF:   You develop chest pain.  You develop an irregular heartbeat.  You develop a rapid heartbeat.   This information is not intended to replace advice given to you by your health care provider. Make sure you discuss any questions you have with your health care provider.   Document Released: 10/09/2005 Document Revised: 10/30/2014 Document Reviewed: 02/24/2014 Elsevier Interactive Patient Education [employer] Inc.   8-10 weeks. Repeat TSH

## 2016-02-17 DIAGNOSIS — Z6841 Body Mass Index (BMI) 40.0 and over, adult: Secondary | ICD-10-CM | POA: Insufficient documentation

## 2016-03-09 ENCOUNTER — Ambulatory Visit (HOSPITAL_COMMUNITY): Payer: Managed Care, Other (non HMO) | Admitting: Psychiatry

## 2016-04-17 ENCOUNTER — Other Ambulatory Visit: Payer: Self-pay | Admitting: Family Medicine

## 2016-04-17 ENCOUNTER — Other Ambulatory Visit (INDEPENDENT_AMBULATORY_CARE_PROVIDER_SITE_OTHER): Payer: Managed Care, Other (non HMO)

## 2016-04-17 DIAGNOSIS — E039 Hypothyroidism, unspecified: Secondary | ICD-10-CM

## 2016-04-17 LAB — T4, FREE: FREE T4: 0.95 ng/dL (ref 0.60–1.60)

## 2016-04-17 LAB — TSH: TSH: 6.16 u[IU]/mL — AB (ref 0.35–4.50)

## 2016-04-21 ENCOUNTER — Ambulatory Visit (INDEPENDENT_AMBULATORY_CARE_PROVIDER_SITE_OTHER): Payer: Managed Care, Other (non HMO) | Admitting: Family Medicine

## 2016-04-21 ENCOUNTER — Encounter: Payer: Self-pay | Admitting: Family Medicine

## 2016-04-21 VITALS — BP 143/93 | HR 90 | Temp 98.8°F | Resp 20 | Wt 268.8 lb

## 2016-04-21 DIAGNOSIS — I10 Essential (primary) hypertension: Secondary | ICD-10-CM

## 2016-04-21 DIAGNOSIS — E039 Hypothyroidism, unspecified: Secondary | ICD-10-CM | POA: Diagnosis not present

## 2016-04-21 MED ORDER — LEVOTHYROXINE SODIUM 112 MCG PO TABS: 112.0000 ug | ORAL_TABLET | Freq: Every day | ORAL | Status: DC

## 2016-04-21 MED ORDER — HYDROCHLOROTHIAZIDE 12.5 MG PO TABS: 12.5000 mg | ORAL_TABLET | Freq: Every day | ORAL | Status: DC

## 2016-04-21 NOTE — Patient Instructions (Signed)
Start 12.5 mg  HCTZ (water pill). You will need to make an appt to see the nurse in 1 week to have BP recheck.  I have called in a higher dose of thyroid med 112 mcg now. Lab appt in 8 weeks for retest. Ordered placed.
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
## 2016-05-19 ENCOUNTER — Telehealth: Payer: Self-pay | Admitting: Family Medicine

## 2016-05-19 ENCOUNTER — Other Ambulatory Visit (INDEPENDENT_AMBULATORY_CARE_PROVIDER_SITE_OTHER): Payer: Managed Care, Other (non HMO)

## 2016-05-19 DIAGNOSIS — E039 Hypothyroidism, unspecified: Secondary | ICD-10-CM

## 2016-05-19 LAB — TSH: TSH: 4.32 u[IU]/mL (ref 0.35–4.50)

## 2016-05-19 MED ORDER — LEVOTHYROXINE SODIUM 112 MCG PO TABS: 112.0000 ug | ORAL_TABLET | Freq: Every day | ORAL | 3 refills | Status: DC

## 2016-05-19 NOTE — Telephone Encounter (Signed)
Please call pt:  - her thyroid level is normal.

## 2016-05-19 NOTE — Telephone Encounter (Signed)
Spoke with patient reviewed lab results. 

## 2016-06-16 ENCOUNTER — Other Ambulatory Visit: Payer: Self-pay | Admitting: *Deleted

## 2016-07-19 ENCOUNTER — Telehealth: Payer: Self-pay | Admitting: Family Medicine

## 2016-07-19 ENCOUNTER — Other Ambulatory Visit: Payer: Self-pay | Admitting: *Deleted

## 2016-07-19 NOTE — Telephone Encounter (Signed)
Patient requesting RF of thyroid meds.  She takes levothyroxine 112mcg.  Please sent RF to [facility].  Patient has f/u appointment 07/25/16.

## 2016-07-19 NOTE — Telephone Encounter (Signed)
Left message for patient Rx was sent to her pharmacy on 05/19/16 for 90 day supply with 3 refills. Advised patient to call pharmacy for refill.

## 2016-07-20 NOTE — Telephone Encounter (Signed)
She will not have enough to last her until her next appt.  Please advise.

## 2016-07-20 NOTE — Telephone Encounter (Signed)
Called left message on patient voice mail she has refills at her pharmacy.

## 2016-07-25 ENCOUNTER — Ambulatory Visit: Payer: Managed Care, Other (non HMO) | Admitting: Family Medicine

## 2016-10-24 ENCOUNTER — Ambulatory Visit (INDEPENDENT_AMBULATORY_CARE_PROVIDER_SITE_OTHER): Payer: Managed Care, Other (non HMO) | Admitting: Family Medicine

## 2016-10-24 ENCOUNTER — Encounter: Payer: Self-pay | Admitting: Family Medicine

## 2016-10-24 VITALS — BP 146/78 | HR 94 | Temp 98.4°F | Resp 20 | Ht 66.0 in | Wt 282.5 lb

## 2016-10-24 DIAGNOSIS — I10 Essential (primary) hypertension: Secondary | ICD-10-CM | POA: Diagnosis not present

## 2016-10-24 DIAGNOSIS — E039 Hypothyroidism, unspecified: Secondary | ICD-10-CM | POA: Diagnosis not present

## 2016-10-24 MED ORDER — LEVOTHYROXINE SODIUM 112 MCG PO TABS: 112.0000 ug | ORAL_TABLET | Freq: Every day | ORAL | 3 refills | Status: DC

## 2016-10-24 MED ORDER — HYDROCHLOROTHIAZIDE 25 MG PO TABS: 25.0000 mg | ORAL_TABLET | Freq: Every day | ORAL | 1 refills | Status: DC

## 2016-10-24 NOTE — Progress Notes (Signed)
Brandy Pierce , 12/26/1985, 31 y.o., female MRN: [id number] Patient Care Team    Relationship Specialty Notifications Start End  Natalia Leatherwood, DO PCP - General Family Medicine  12/31/15   Candice Camp, MD Consulting Physician Obstetrics and Gynecology  01/03/16     CC: Hypertension/hypothyroidism Subjective: Hypothyroidism, unspecified hypothyroidism type Patient reports compliance with 112 g Synthroid daily. She has gained presently 14 pounds in the last few months. She states she has not been active and has been overeating when she was away in [place]. She is needing refills on her thyroid medication today. Last TSH was completed 05/19/2016 and was normal at 4.32 on current dose.  Hypertension: Patient was started on low-dose HCTZ June 2017. She has not followed up on this condition. She has not been testing her blood pressure. She reports being in a stressful situation, living in [place] for the last few months and ran out of her HCTZ. She has gained 14 pounds and has been inactive. She reports not eating healthy or taking care of herself over the last few months while in [place]. Family history of heart disease and hypertension. She denies current chest pain, shortness of breath, dizziness or lower extremity edema. She reports she has had some dizziness over the last few months, but not currently.  No Known Allergies Social History  Substance Use Topics  . Smoking status: Never Smoker  . Smokeless tobacco: Never Used  . Alcohol use No   Past Medical History:  Diagnosis Date  . Depression   . Gestational diabetes   . Hypertension   . Hypothyroidism   . Obesity   . Polycystic disease, ovaries    Past Surgical History:  Procedure Laterality Date  . NO PAST SURGERIES     Family History  Problem Relation Age of Onset  . Hypertension Mother   . Mental illness Mother     depression  . Arthritis Father   . Hypertension Father   . Diabetes Father   .  Alcohol abuse Paternal Grandfather   . Cirrhosis Paternal Grandfather   . Heart disease Paternal Grandfather   . Anesthesia problems Neg Hx    Allergies as of 10/24/2016   No Known Allergies     Medication List       Accurate as of 10/24/16  3:41 PM. Always use your most recent med list.          cloNIDine 0.1 MG tablet Commonly known as:  CATAPRES TK 1 T PO QHS   CONCERTA 54 MG CR tablet Generic drug:  methylphenidate   hydrochlorothiazide 25 MG tablet Commonly known as:  HYDRODIURIL Take 1 tablet (25 mg total) by mouth daily.   levothyroxine 112 MCG tablet Commonly known as:  SYNTHROID, LEVOTHROID Take 1 tablet (112 mcg total) by mouth daily.   zolpidem 10 MG tablet Commonly known as:  AMBIEN Reported on 04/21/2016       No results found for this or any previous visit (from the past 24 hour(s)). No results found.   ROS: Negative, with the exception of above mentioned in HPI   Objective:  BP (!) 146/78 (BP Location: Right Leg, Patient Position: Sitting, Cuff Size: Large)   Pulse 94   Temp 98.4 F (36.9 C)   Resp 20   Ht 5\' 6"  (1.676 m)   Wt 282 lb 8 oz (128.1 kg)   SpO2 99%   BMI 45.60 kg/m  Body mass index is 45.6 kg/m. Gen: Afebrile. No acute  distress. Nontoxic in appearance, well developed, well nourished. Pleasant female. HENT: AT. [place].  MMM Eyes:Pupils Equal Round Reactive to light, Extraocular movements intact,  Conjunctiva without redness, discharge or icterus. CV: RRR no murmur, no edema Chest: CTAB, no wheeze or crackles. Good air movement, normal resp effort.  Neuro: Normal gait. PERLA. EOMi. Alert. Oriented x3   Assessment/Plan: Brandy Pierce is a 31 y.o. female present for OV for  Essential hypertension, benign/Morbid obesity (HCC) - Uncontrolled - Discussed low-sodium diet, exercising greater than 150 minutes a week. Weight loss is key for her and lowering her blood pressure. -Increase HCTZ to 25 mg daily, patient is to monitor  her blood pressure in the outpatient setting and if consistently above 140/80 after starting medication she is to make an appointment to be seen. - hydrochlorothiazide (HYDRODIURIL) 25 MG tablet; Take 1 tablet (25 mg total) by mouth daily.  Dispense: 90 tablet; Refill: 1 - Consider urine microalbumin - Follow-up in 3 months  Hypothyroidism, unspecified type - Refills provided for 1 year on Synthroid 112 g. Follow yearly, unless developing symptoms.  Follow-up in 3 months on hypertension  electronically signed by:  Felix Pacinienee Benelli Winther, DO  [facility] [place]

## 2016-10-24 NOTE — Patient Instructions (Signed)
I have called in refills on thyroid medicine. Restart HCTZ 25 mg daily and montior BP: if above 140/80, please be seen.  Eat a diet low in sodium and exercise > 150 minutes a week.  Follow up on BP in 3 months.

## 2017-01-26 ENCOUNTER — Ambulatory Visit: Payer: Managed Care, Other (non HMO) | Admitting: Family Medicine

## 2017-06-04 ENCOUNTER — Telehealth: Payer: Self-pay | Admitting: Family Medicine

## 2017-06-04 ENCOUNTER — Encounter: Payer: Self-pay | Admitting: Family Medicine

## 2017-06-04 ENCOUNTER — Ambulatory Visit (INDEPENDENT_AMBULATORY_CARE_PROVIDER_SITE_OTHER): Payer: Managed Care, Other (non HMO) | Admitting: Family Medicine

## 2017-06-04 VITALS — BP 154/98 | HR 86 | Temp 98.2°F | Resp 20 | Ht 66.0 in | Wt 270.5 lb

## 2017-06-04 DIAGNOSIS — I10 Essential (primary) hypertension: Secondary | ICD-10-CM

## 2017-06-04 DIAGNOSIS — N912 Amenorrhea, unspecified: Secondary | ICD-10-CM | POA: Diagnosis not present

## 2017-06-04 DIAGNOSIS — Z3490 Encounter for supervision of normal pregnancy, unspecified, unspecified trimester: Secondary | ICD-10-CM

## 2017-06-04 DIAGNOSIS — E039 Hypothyroidism, unspecified: Secondary | ICD-10-CM | POA: Diagnosis not present

## 2017-06-04 DIAGNOSIS — R110 Nausea: Secondary | ICD-10-CM

## 2017-06-04 DIAGNOSIS — E010 Iodine-deficiency related diffuse (endemic) goiter: Secondary | ICD-10-CM

## 2017-06-04 LAB — POCT URINE PREGNANCY: PREG TEST UR: POSITIVE — AB

## 2017-06-04 MED ORDER — VITAMIN B-6 25 MG PO TABS: 25.0000 mg | ORAL_TABLET | Freq: Three times a day (TID) | ORAL | 0 refills | Status: DC | PRN

## 2017-06-04 MED ORDER — ONDANSETRON HCL 4 MG PO TABS: 4.0000 mg | ORAL_TABLET | Freq: Three times a day (TID) | ORAL | 0 refills | Status: DC | PRN

## 2017-06-04 MED ORDER — LABETALOL HCL 100 MG PO TABS: 100.0000 mg | ORAL_TABLET | Freq: Two times a day (BID) | ORAL | 0 refills | Status: DC

## 2017-06-04 MED ORDER — LABETALOL HCL 100 MG PO TABS: 100.0000 mg | ORAL_TABLET | Freq: Two times a day (BID) | ORAL | 0 refills | Status: AC

## 2017-06-04 NOTE — Progress Notes (Signed)
Brandy Pierce , 16-Jun-1986, 31 y.o., female MRN: [id number] Patient Care Team    Relationship Specialty Notifications Start End  Natalia Leatherwood, DO PCP - General Family Medicine  12/31/15   Candice Camp, MD Consulting Physician Obstetrics and Gynecology  01/03/16     Chief Complaint  Patient presents with  . Hypothyroidism  . Hypertension     Subjective: Pt presents for an F/U on her HTN and thyroid disorder. She has been compliant with synthroid 112 mcg daily. She has 1 pill left. She reports amenorrhea. Patient's last menstrual period was 04/08/2017. She has had 3 + pregnancy tests at home and has an appt to establish with OB this week. This was not a planned pregnancy. She had been compliant with HCTZ 25 mg QD to control BP, but once she found out she was pregnant she stopped her HCTZ and all other medications except her synthroid. She reports a great deal of nausea. Not able to keep much nutrition down. She states she had difficulty with nausea and vomit her first pregnancy.   No flowsheet data found.  No Known Allergies Social History  Substance Use Topics  . Smoking status: Never Smoker  . Smokeless tobacco: Never Used  . Alcohol use No   Past Medical History:  Diagnosis Date  . Depression   . Gestational diabetes   . Hypertension   . Hypothyroidism   . Obesity   . Polycystic disease, ovaries    Past Surgical History:  Procedure Laterality Date  . NO PAST SURGERIES     Family History  Problem Relation Age of Onset  . Hypertension Mother   . Mental illness Mother        depression  . Arthritis Father   . Hypertension Father   . Diabetes Father   . Alcohol abuse Paternal Grandfather   . Cirrhosis Paternal Grandfather   . Heart disease Paternal Grandfather   . Anesthesia problems Neg Hx    Allergies as of 06/04/2017   No Known Allergies     Medication List       Accurate as of 06/04/17  4:28 PM. Always use your most recent med list.            levothyroxine 112 MCG tablet Commonly known as:  SYNTHROID, LEVOTHROID Take 1 tablet (112 mcg total) by mouth daily.       All past medical history, surgical history, allergies, family history, immunizations andmedications were updated in the EMR today and reviewed under the history and medication portions of their EMR.     ROS: Negative, with the exception of above mentioned in HPI   Objective:  BP (!) 154/98 (BP Location: Right Arm, Patient Position: Sitting, Cuff Size: Large)   Pulse 86   Temp 98.2 F (36.8 C)   Resp 20   Ht 5\' 6"  (1.676 m)   Wt 270 lb 8 oz (122.7 kg)   LMP 04/08/2017   SpO2 99%   BMI 43.66 kg/m  Body mass index is 43.66 kg/m. Gen: Afebrile. No acute distress. Nontoxic in appearance, well developed, well nourished.  HENT: AT. [place].  MMM, no oral lesions. Eyes:Pupils Equal Round Reactive to light, Extraocular movements intact,  Conjunctiva without redness, discharge or icterus. Neck/lymp/endocrine: Supple,no lymphadenopathy, mild hypertrophy of right side of neck. CV: RRR no murmur, no edema Chest: CTAB, no wheeze or crackles. Good air movement, normal resp effort.  Abd: Soft. obese. NTND. BS present. no Masses palpated. No rebound or guarding.  Neuro: Normal gait. PERLA. EOMi. Alert. Oriented x3  Psych: Normal affect, dress and demeanor. Normal speech. Normal thought content and judgment.  No exam data present No results found. No results found for this or any previous visit (from the past 24 hour(s)).  Assessment/Plan: Brandy Sellerrlene Kristene Bartmess is a 31 y.o. female present for OV for  Amenorrhea/nausea/prenancy - positive urine pregnancy test today. Pt was provided with printed result.  - She has OB establishment set up for this week.  - She is  taking OTC prenatal. - nausea: prescribed B6 daily supplement and zofran PRN.  - Hydrate, increase water consumption > 80-100 ounces a day.  Hypothyroidism, unspecified type - last TSH 05/19/2016 normal. Given  pregnancy will recheck and adjust dose if needed. Refills will be called in tomorrow once results received. Discussed the need for close follow up with OB. She also has some enlargement right side of neck. Will obtain US of neck/thyroid. Reassurance given. - TSH - T3, free - T4, free  Essential hypertension, benign Pt with dx of hypertension for some time, had been placed on HCTZ 25 mg and remained borderline. However, after + preg test she stopped medication. BP today elevated > 150/90, discussed treatment during pregnancy and need for close follow up OB.  - Started labetalol 50 mg BID. She has OB appt this week, and discussed they will likely increase to 100 mg BID. Pt verbalized understanding.  - She will follow with OB.    Reviewed expectations re: course of current medical issues.  Discussed self-management of symptoms.  Outlined signs and symptoms indicating need for more acute intervention.  Patient verbalized understanding and all questions were answered.  Patient received an After-Visit Summary.    Orders Placed This Encounter  Procedures  . TSH  . T3, free  . T4, free  . POCT urine pregnancy     Note is dictated utilizing voice recognition software. Although note has been proof read prior to signing, occasional typographical errors still can be missed. If any questions arise, please do not hesitate to call for verification.   electronically signed by:  Felix Pacinienee Kuneff, DO  [facility] [place]

## 2017-06-04 NOTE — Telephone Encounter (Signed)
Failed transmisison of scripts to [facility], their symstem is down. Called pt and [facility]  [facility]

## 2017-06-04 NOTE — Patient Instructions (Signed)
Start 1/2 tab labetalol every 12 hours. Tomorrow once rechecked, they may need to increase to 1 tab daily.   zofran and B6 (Pyridoxine) for nausea. HYDRATE.   Pregnant--> positive.

## 2017-06-05 ENCOUNTER — Telehealth: Payer: Self-pay | Admitting: Family Medicine

## 2017-06-05 LAB — T4, FREE: FREE T4: 1.5 ng/dL (ref 0.8–1.8)

## 2017-06-05 LAB — TSH: TSH: 6.96 mIU/L — ABNORMAL HIGH

## 2017-06-05 LAB — T3, FREE: T3, Free: 2.7 pg/mL (ref 2.3–4.2)

## 2017-06-05 MED ORDER — LEVOTHYROXINE SODIUM 125 MCG PO TABS: 125.0000 ug | ORAL_TABLET | Freq: Every day | ORAL | 0 refills | Status: AC

## 2017-06-05 NOTE — Telephone Encounter (Signed)
Patient notified and verbalized understanding. 

## 2017-06-05 NOTE — Telephone Encounter (Signed)
Please call pt: - I am increasing her dose. Her TSH was midly elevated. She will need to follow with her OB in 6-8 weeks for repeat test and management during pregnancy. [facility]

## 2017-06-05 NOTE — Telephone Encounter (Signed)
Message left on voicemail to return call.

## 2017-06-11 ENCOUNTER — Telehealth: Payer: Self-pay | Admitting: Family Medicine

## 2017-06-11 ENCOUNTER — Ambulatory Visit
Admission: RE | Admit: 2017-06-11 | Discharge: 2017-06-11 | Disposition: A | Discharge status: Home / Self Care | Payer: Managed Care, Other (non HMO) | Source: Ambulatory Visit | Attending: Family Medicine | Admitting: Family Medicine

## 2017-06-11 ENCOUNTER — Encounter: Payer: Self-pay | Admitting: *Deleted

## 2017-06-11 DIAGNOSIS — E010 Iodine-deficiency related diffuse (endemic) goiter: Secondary | ICD-10-CM

## 2017-06-11 DIAGNOSIS — E039 Hypothyroidism, unspecified: Secondary | ICD-10-CM

## 2017-06-11 NOTE — Telephone Encounter (Signed)
Left message with results on voice mail per DPR also sent in My Chart.

## 2017-06-11 NOTE — Telephone Encounter (Signed)
Her thyroid ultrasound resulted with a mildly enlarged thyroid, but without any nodules, no follow-up imaging needed.

## 2017-06-14 ENCOUNTER — Inpatient Hospital Stay (HOSPITAL_COMMUNITY): Payer: Managed Care, Other (non HMO)

## 2017-06-14 ENCOUNTER — Encounter (HOSPITAL_COMMUNITY): Payer: Self-pay

## 2017-06-14 ENCOUNTER — Inpatient Hospital Stay (HOSPITAL_COMMUNITY)
Admission: AD | Admit: 2017-06-14 | Discharge: 2017-06-14 | Disposition: A | Discharge status: Home / Self Care | Payer: Managed Care, Other (non HMO) | Source: Ambulatory Visit | Attending: Obstetrics and Gynecology | Admitting: Obstetrics and Gynecology

## 2017-06-14 DIAGNOSIS — O418X10 Other specified disorders of amniotic fluid and membranes, first trimester, not applicable or unspecified: Secondary | ICD-10-CM

## 2017-06-14 DIAGNOSIS — O99281 Endocrine, nutritional and metabolic diseases complicating pregnancy, first trimester: Secondary | ICD-10-CM | POA: Diagnosis not present

## 2017-06-14 DIAGNOSIS — F329 Major depressive disorder, single episode, unspecified: Secondary | ICD-10-CM | POA: Diagnosis not present

## 2017-06-14 DIAGNOSIS — O99211 Obesity complicating pregnancy, first trimester: Secondary | ICD-10-CM | POA: Diagnosis not present

## 2017-06-14 DIAGNOSIS — O10911 Unspecified pre-existing hypertension complicating pregnancy, first trimester: Secondary | ICD-10-CM | POA: Diagnosis not present

## 2017-06-14 DIAGNOSIS — O209 Hemorrhage in early pregnancy, unspecified: Secondary | ICD-10-CM | POA: Diagnosis not present

## 2017-06-14 DIAGNOSIS — E282 Polycystic ovarian syndrome: Secondary | ICD-10-CM | POA: Insufficient documentation

## 2017-06-14 DIAGNOSIS — Z79899 Other long term (current) drug therapy: Secondary | ICD-10-CM | POA: Diagnosis not present

## 2017-06-14 DIAGNOSIS — O4691 Antepartum hemorrhage, unspecified, first trimester: Secondary | ICD-10-CM | POA: Diagnosis not present

## 2017-06-14 DIAGNOSIS — O208 Other hemorrhage in early pregnancy: Secondary | ICD-10-CM | POA: Diagnosis present

## 2017-06-14 DIAGNOSIS — Z3A09 9 weeks gestation of pregnancy: Secondary | ICD-10-CM | POA: Diagnosis not present

## 2017-06-14 DIAGNOSIS — O99341 Other mental disorders complicating pregnancy, first trimester: Secondary | ICD-10-CM | POA: Diagnosis not present

## 2017-06-14 DIAGNOSIS — O468X1 Other antepartum hemorrhage, first trimester: Secondary | ICD-10-CM | POA: Diagnosis not present

## 2017-06-14 DIAGNOSIS — E039 Hypothyroidism, unspecified: Secondary | ICD-10-CM | POA: Diagnosis not present

## 2017-06-14 LAB — TYPE AND SCREEN
ABO/RH(D): A POS
Antibody Screen: NEGATIVE

## 2017-06-14 LAB — CBC
HCT: 38.9 % (ref 36.0–46.0)
Hemoglobin: 13.4 g/dL (ref 12.0–15.0)
MCH: 29.1 pg (ref 26.0–34.0)
MCHC: 34.4 g/dL (ref 30.0–36.0)
MCV: 84.4 fL (ref 78.0–100.0)
Platelets: 285 10*3/uL (ref 150–400)
RBC: 4.61 MIL/uL (ref 3.87–5.11)
RDW: 13.7 % (ref 11.5–15.5)
WBC: 9.7 10*3/uL (ref 4.0–10.5)

## 2017-06-14 LAB — POCT PREGNANCY, URINE: Preg Test, Ur: POSITIVE — AB

## 2017-06-14 NOTE — MAU Note (Addendum)
Pt states about 30 mins ago she had a large amount of vaginal bleeding, states she passed a lot of clots. Denies pain but states stomach "feels weird." LMP: 04/07/2017. States she is [redacted] weeks pregnant and EDD is 01/13/2018. Pt is very tearful in triage.
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
## 2017-06-14 NOTE — Discharge Instructions (Signed)
Subchorionic Hematoma °A subchorionic hematoma is a gathering of blood between the outer wall of the placenta and the inner wall of the womb (uterus). The placenta is the organ that connects the fetus to the wall of the uterus. The placenta performs the feeding, breathing (oxygen to the fetus), and waste removal (excretory work) of the fetus. °Subchorionic hematoma is the most common abnormality found on a result from ultrasonography done during the first trimester or early second trimester of pregnancy. If there has been little or no vaginal bleeding, early small hematomas usually shrink on their own and do not affect your baby or pregnancy. The blood is gradually absorbed over 1-2 weeks. When bleeding starts later in pregnancy or the hematoma is larger or occurs in an older pregnant woman, the outcome may not be as good. Larger hematomas may get bigger, which increases the chances for miscarriage. Subchorionic hematoma also increases the risk of premature detachment of the placenta from the uterus, preterm (premature) labor, and stillbirth. °Follow these instructions at home: °· Stay on bed rest if your health care provider recommends this. Although bed rest will not prevent more bleeding or prevent a miscarriage, your health care provider may recommend bed rest until you are advised otherwise. °· Avoid heavy lifting (more than 10 lb [4.5 kg]), exercise, sexual intercourse, or douching as directed by your health care provider. °· Keep track of the number of pads you use each day and how soaked (saturated) they are. Write down this information. °· Do not use tampons. °· Keep all follow-up appointments as directed by your health care provider. Your health care provider may ask you to have follow-up blood tests or ultrasound tests or both. °Get help right away if: °· You have severe cramps in your stomach, back, abdomen, or pelvis. °· You have a fever. °· You pass large clots or tissue. Save any tissue for your  health care provider to look at. °· Your bleeding increases or you become lightheaded, feel weak, or have fainting episodes. °This information is not intended to replace advice given to you by your health care provider. Make sure you discuss any questions you have with your health care provider. °Document Released: 01/24/2007 Document Revised: 03/16/2016 Document Reviewed: 05/08/2013 °Elsevier Interactive Patient Education © 2017 Elsevier Inc. ° °

## 2017-06-14 NOTE — MAU Note (Signed)
Pt given verbal discharge orders by Dorathy Kinsman, CNM. Pt left before signing EVS signature.

## 2017-06-15 LAB — URINALYSIS, ROUTINE W REFLEX MICROSCOPIC
Bilirubin Urine: NEGATIVE
GLUCOSE, UA: NEGATIVE mg/dL
Ketones, ur: NEGATIVE mg/dL
LEUKOCYTES UA: NEGATIVE
Nitrite: NEGATIVE
PH: 6 (ref 5.0–8.0)
PROTEIN: 100 mg/dL — AB
Specific Gravity, Urine: 1 — ABNORMAL LOW (ref 1.005–1.030)
Squamous Epithelial / LPF: NONE SEEN

## 2017-06-15 LAB — ABO/RH: ABO/RH(D): A POS

## 2017-06-22 LAB — OB RESULTS CONSOLE RPR: RPR: NONREACTIVE

## 2017-06-22 LAB — OB RESULTS CONSOLE GC/CHLAMYDIA
Chlamydia: NEGATIVE
Gonorrhea: NEGATIVE

## 2017-06-22 LAB — OB RESULTS CONSOLE ABO/RH: RH TYPE: POSITIVE

## 2017-06-22 LAB — OB RESULTS CONSOLE GBS: STREP GROUP B AG: POSITIVE

## 2017-06-22 LAB — OB RESULTS CONSOLE HIV ANTIBODY (ROUTINE TESTING): HIV: NONREACTIVE

## 2017-06-22 LAB — OB RESULTS CONSOLE ANTIBODY SCREEN: ANTIBODY SCREEN: NEGATIVE

## 2017-06-22 LAB — OB RESULTS CONSOLE RUBELLA ANTIBODY, IGM: Rubella: IMMUNE

## 2017-06-22 LAB — OB RESULTS CONSOLE HEPATITIS B SURFACE ANTIGEN: HEP B S AG: NEGATIVE

## 2017-09-06 ENCOUNTER — Other Ambulatory Visit: Payer: Self-pay | Admitting: Family Medicine

## 2017-09-07 ENCOUNTER — Other Ambulatory Visit: Payer: Self-pay | Admitting: Family Medicine

## 2017-10-23 NOTE — L&D Delivery Note (Signed)
CTSP for rapid progression to complete.  FHT now in 60s x 4 minutes.     Vtx at +3 station and ROA.  Pt desires VE.  I discussed the R&B of VE including but not limited to injury to fetus but the potential benefit of expedited delivery.  She gives her informed consent and wishes to proceed. On the 2nd pull delivered viable female apgars 6,8 over 1st degree ml lac.  Nuchal x 2 reduced on perineum.  NICU in attendance   Placenta delivered spontaneously intact with 3VC. Repair with 3-0 Chromic with good support and hemostasis noted and R/V exam confirms.  PH art was sent.  [place] cord blood was not done.  Mother and baby were doing well.  EBL 450cc  Candice Campavid [place] Sandall, MD

## 2017-10-24 ENCOUNTER — Other Ambulatory Visit (HOSPITAL_COMMUNITY): Payer: Self-pay | Admitting: Obstetrics & Gynecology

## 2017-10-24 ENCOUNTER — Other Ambulatory Visit: Payer: Self-pay | Admitting: Family Medicine

## 2017-10-24 ENCOUNTER — Encounter (HOSPITAL_COMMUNITY): Payer: Self-pay | Admitting: *Deleted

## 2017-10-24 DIAGNOSIS — O10919 Unspecified pre-existing hypertension complicating pregnancy, unspecified trimester: Secondary | ICD-10-CM

## 2017-10-24 DIAGNOSIS — O99283 Endocrine, nutritional and metabolic diseases complicating pregnancy, third trimester: Secondary | ICD-10-CM

## 2017-10-24 DIAGNOSIS — Z3A28 28 weeks gestation of pregnancy: Secondary | ICD-10-CM

## 2017-10-24 DIAGNOSIS — O283 Abnormal ultrasonic finding on antenatal screening of mother: Secondary | ICD-10-CM

## 2017-10-24 DIAGNOSIS — E039 Hypothyroidism, unspecified: Secondary | ICD-10-CM

## 2017-10-24 DIAGNOSIS — I10 Essential (primary) hypertension: Secondary | ICD-10-CM

## 2017-10-24 DIAGNOSIS — Z3689 Encounter for other specified antenatal screening: Secondary | ICD-10-CM

## 2017-10-24 DIAGNOSIS — Z3490 Encounter for supervision of normal pregnancy, unspecified, unspecified trimester: Secondary | ICD-10-CM

## 2017-10-24 DIAGNOSIS — O24419 Gestational diabetes mellitus in pregnancy, unspecified control: Secondary | ICD-10-CM

## 2017-10-25 ENCOUNTER — Encounter (HOSPITAL_COMMUNITY): Payer: Self-pay

## 2017-10-25 ENCOUNTER — Ambulatory Visit (HOSPITAL_COMMUNITY)
Admission: RE | Admit: 2017-10-25 | Discharge: 2017-10-25 | Disposition: A | Discharge status: Home / Self Care | Payer: Self-pay | Source: Ambulatory Visit | Attending: Obstetrics & Gynecology | Admitting: Obstetrics & Gynecology

## 2017-12-20 ENCOUNTER — Encounter (HOSPITAL_COMMUNITY): Payer: Self-pay | Admitting: *Deleted

## 2017-12-20 ENCOUNTER — Telehealth (HOSPITAL_COMMUNITY): Payer: Self-pay | Admitting: *Deleted

## 2017-12-20 NOTE — Telephone Encounter (Signed)
Preadmission screen  

## 2017-12-31 ENCOUNTER — Inpatient Hospital Stay (HOSPITAL_COMMUNITY)
Admission: AD | Admit: 2017-12-31 | Payer: Managed Care, Other (non HMO) | Source: Ambulatory Visit | Admitting: Obstetrics and Gynecology

## 2018-01-01 ENCOUNTER — Encounter (HOSPITAL_COMMUNITY): Payer: Self-pay

## 2018-01-01 ENCOUNTER — Inpatient Hospital Stay (HOSPITAL_COMMUNITY)
Admission: RE | Admit: 2018-01-01 | Discharge: 2018-01-03 | DRG: 805 | Disposition: A | Discharge status: Home / Self Care | Payer: Managed Care, Other (non HMO) | Source: Ambulatory Visit | Attending: Obstetrics and Gynecology | Admitting: Obstetrics and Gynecology

## 2018-01-01 ENCOUNTER — Other Ambulatory Visit: Payer: Self-pay

## 2018-01-01 DIAGNOSIS — O1002 Pre-existing essential hypertension complicating childbirth: Principal | ICD-10-CM | POA: Diagnosis present

## 2018-01-01 DIAGNOSIS — O99824 Streptococcus B carrier state complicating childbirth: Secondary | ICD-10-CM | POA: Diagnosis present

## 2018-01-01 DIAGNOSIS — Z3490 Encounter for supervision of normal pregnancy, unspecified, unspecified trimester: Secondary | ICD-10-CM | POA: Diagnosis present

## 2018-01-01 DIAGNOSIS — Z3A38 38 weeks gestation of pregnancy: Secondary | ICD-10-CM

## 2018-01-01 DIAGNOSIS — O4593 Premature separation of placenta, unspecified, third trimester: Secondary | ICD-10-CM | POA: Diagnosis present

## 2018-01-01 LAB — COMPREHENSIVE METABOLIC PANEL
ALBUMIN: 3.5 g/dL (ref 3.5–5.0)
ALBUMIN: 3 g/dL — AB (ref 3.5–5.0)
ALK PHOS: 652 U/L — AB (ref 38–126)
ALK PHOS: 548 U/L — AB (ref 38–126)
ALT: 29 U/L (ref 14–54)
ALT: 26 U/L (ref 14–54)
ANION GAP: 9 (ref 5–15)
AST: 37 U/L (ref 15–41)
AST: 33 U/L (ref 15–41)
Anion gap: 10 (ref 5–15)
BILIRUBIN TOTAL: 0.6 mg/dL (ref 0.3–1.2)
BILIRUBIN TOTAL: 1 mg/dL (ref 0.3–1.2)
BUN: 10 mg/dL (ref 6–20)
BUN: 10 mg/dL (ref 6–20)
CALCIUM: 9.4 mg/dL (ref 8.9–10.3)
CALCIUM: 8.6 mg/dL — AB (ref 8.9–10.3)
CO2: 22 mmol/L (ref 22–32)
CO2: 20 mmol/L — ABNORMAL LOW (ref 22–32)
CREATININE: 0.7 mg/dL (ref 0.44–1.00)
Chloride: 102 mmol/L (ref 101–111)
Chloride: 105 mmol/L (ref 101–111)
Creatinine, Ser: 0.65 mg/dL (ref 0.44–1.00)
GFR calc Af Amer: >60 mL/min (ref >60)
GFR calc Af Amer: >60 mL/min (ref >60)
GLUCOSE: 100 mg/dL — AB (ref 65–99)
GLUCOSE: 122 mg/dL — AB (ref 65–99)
Potassium: 3.9 mmol/L (ref 3.5–5.1)
Potassium: 4.1 mmol/L (ref 3.5–5.1)
Sodium: 134 mmol/L — ABNORMAL LOW (ref 135–145)
Sodium: 134 mmol/L — ABNORMAL LOW (ref 135–145)
TOTAL PROTEIN: 6.1 g/dL — AB (ref 6.5–8.1)
TOTAL PROTEIN: 5.3 g/dL — AB (ref 6.5–8.1)

## 2018-01-01 LAB — PREPARE RBC (CROSSMATCH)

## 2018-01-01 LAB — CBC
HCT: 36.8 % (ref 36.0–46.0)
HEMATOCRIT: 34.2 % — AB (ref 36.0–46.0)
HEMOGLOBIN: 11.3 g/dL — AB (ref 12.0–15.0)
Hemoglobin: 12.2 g/dL (ref 12.0–15.0)
MCH: 28.2 pg (ref 26.0–34.0)
MCH: 28.1 pg (ref 26.0–34.0)
MCHC: 33.2 g/dL (ref 30.0–36.0)
MCHC: 33 g/dL (ref 30.0–36.0)
MCV: 85.2 fL (ref 78.0–100.0)
MCV: 85.1 fL (ref 78.0–100.0)
PLATELETS: 214 10*3/uL (ref 150–400)
Platelets: 198 10*3/uL (ref 150–400)
RBC: 4.32 MIL/uL (ref 3.87–5.11)
RBC: 4.02 MIL/uL (ref 3.87–5.11)
RDW: 14.3 % (ref 11.5–15.5)
RDW: 14.2 % (ref 11.5–15.5)
WBC: 9.2 10*3/uL (ref 4.0–10.5)
WBC: 8.6 10*3/uL (ref 4.0–10.5)

## 2018-01-01 LAB — RPR: RPR Ser Ql: NONREACTIVE

## 2018-01-01 MED ORDER — FENTANYL CITRATE (PF) 100 MCG/2ML IJ SOLN
100.0000 ug | Freq: Once | INTRAMUSCULAR | Status: AC
  Administered 2018-01-01: 100 ug via INTRAVENOUS

## 2018-01-01 MED ORDER — IBUPROFEN 600 MG PO TABS
600.0000 mg | ORAL_TABLET | Freq: Four times a day (QID) | ORAL | Status: DC
  Administered 2018-01-01 – 2018-01-03 (×7): 600 mg via ORAL
  Filled 2018-01-01 (×8): qty 1

## 2018-01-01 MED ORDER — DIBUCAINE 1 % RE OINT: 1.0000 "application " | TOPICAL_OINTMENT | RECTAL | Status: DC | PRN

## 2018-01-01 MED ORDER — MISOPROSTOL 200 MCG PO TABS
800.0000 ug | ORAL_TABLET | Freq: Once | ORAL | Status: AC
  Administered 2018-01-01: 800 ug via RECTAL

## 2018-01-01 MED ORDER — PRENATAL MULTIVITAMIN CH
1.0000 | ORAL_TABLET | Freq: Every day | ORAL | Status: DC
Start: 2018-01-02 — End: 2018-01-03
  Administered 2018-01-02 – 2018-01-03 (×2): 1 via ORAL
  Filled 2018-01-01 (×2): qty 1

## 2018-01-01 MED ORDER — SODIUM CHLORIDE 0.9 % IV SOLN
Freq: Once | INTRAVENOUS | Status: AC
  Administered 2018-01-02: via INTRAVENOUS

## 2018-01-01 MED ORDER — CEFAZOLIN SODIUM-DEXTROSE 1-4 GM/50ML-% IV SOLN
1.0000 g | Freq: Four times a day (QID) | INTRAVENOUS | Status: AC
  Administered 2018-01-01 – 2018-01-02 (×4): 1 g via INTRAVENOUS
  Filled 2018-01-01 (×4): qty 50

## 2018-01-01 MED ORDER — EPHEDRINE 5 MG/ML INJ
10.0000 mg | INTRAVENOUS | Status: DC | PRN
  Filled 2018-01-01: qty 2

## 2018-01-01 MED ORDER — LACTATED RINGERS IV SOLN
500.0000 mL | INTRAVENOUS | Status: DC | PRN
Start: 2018-01-01 — End: 2018-01-01
  Administered 2018-01-01: 1000 mL via INTRAVENOUS

## 2018-01-01 MED ORDER — LACTATED RINGERS IV SOLN: 500.0000 mL | Freq: Once | INTRAVENOUS | Status: DC

## 2018-01-01 MED ORDER — ONDANSETRON HCL 4 MG PO TABS: 4.0000 mg | ORAL_TABLET | ORAL | Status: DC | PRN

## 2018-01-01 MED ORDER — BENZOCAINE-MENTHOL 20-0.5 % EX AERO: 1.0000 "application " | INHALATION_SPRAY | CUTANEOUS | Status: DC | PRN

## 2018-01-01 MED ORDER — ACETAMINOPHEN 325 MG PO TABS: 650.0000 mg | ORAL_TABLET | ORAL | Status: DC | PRN

## 2018-01-01 MED ORDER — PHENYLEPHRINE 40 MCG/ML (10ML) SYRINGE FOR IV PUSH (FOR BLOOD PRESSURE SUPPORT)
80.0000 ug | PREFILLED_SYRINGE | INTRAVENOUS | Status: DC | PRN
  Filled 2018-01-01: qty 5
  Filled 2018-01-01: qty 10

## 2018-01-01 MED ORDER — FENTANYL CITRATE (PF) 100 MCG/2ML IJ SOLN
INTRAMUSCULAR | Status: AC
  Administered 2018-01-01: 100 ug via INTRAVENOUS
  Filled 2018-01-01: qty 2

## 2018-01-01 MED ORDER — SODIUM CHLORIDE 0.9 % IV SOLN
5.0000 10*6.[IU] | Freq: Once | INTRAVENOUS | Status: AC
  Administered 2018-01-01: 5 10*6.[IU] via INTRAVENOUS
  Filled 2018-01-01: qty 5

## 2018-01-01 MED ORDER — TRANEXAMIC ACID 1000 MG/10ML IV SOLN
1000.0000 mg | Freq: Once | INTRAVENOUS | Status: AC
  Administered 2018-01-01: 1000 mg via INTRAVENOUS
  Filled 2018-01-01: qty 10

## 2018-01-01 MED ORDER — TETANUS-DIPHTH-ACELL PERTUSSIS 5-2.5-18.5 LF-MCG/0.5 IM SUSP: 0.5000 mL | Freq: Once | INTRAMUSCULAR | Status: DC

## 2018-01-01 MED ORDER — OXYCODONE-ACETAMINOPHEN 5-325 MG PO TABS
1.0000 | ORAL_TABLET | ORAL | Status: DC | PRN
  Administered 2018-01-02 (×2): 1 via ORAL
  Filled 2018-01-01 (×2): qty 1

## 2018-01-01 MED ORDER — DIPHENHYDRAMINE HCL 25 MG PO CAPS: 25.0000 mg | ORAL_CAPSULE | Freq: Four times a day (QID) | ORAL | Status: DC | PRN

## 2018-01-01 MED ORDER — OXYTOCIN 40 UNITS IN LACTATED RINGERS INFUSION - SIMPLE MED
1.0000 m[IU]/min | INTRAVENOUS | Status: DC
  Administered 2018-01-01: 2 m[IU]/min via INTRAVENOUS
  Filled 2018-01-01: qty 1000

## 2018-01-01 MED ORDER — BUTORPHANOL TARTRATE 1 MG/ML IJ SOLN
1.0000 mg | INTRAMUSCULAR | Status: DC | PRN
  Administered 2018-01-01: 1 mg via INTRAVENOUS
  Filled 2018-01-01: qty 1

## 2018-01-01 MED ORDER — FENTANYL CITRATE (PF) 100 MCG/2ML IJ SOLN
INTRAMUSCULAR | Status: AC
  Filled 2018-01-01: qty 2

## 2018-01-01 MED ORDER — SENNOSIDES-DOCUSATE SODIUM 8.6-50 MG PO TABS
2.0000 | ORAL_TABLET | ORAL | Status: DC
  Administered 2018-01-02 (×2): 2 via ORAL
  Filled 2018-01-01 (×2): qty 2

## 2018-01-01 MED ORDER — ONDANSETRON HCL 4 MG/2ML IJ SOLN: 4.0000 mg | INTRAMUSCULAR | Status: DC | PRN

## 2018-01-01 MED ORDER — OXYCODONE-ACETAMINOPHEN 5-325 MG PO TABS: 2.0000 | ORAL_TABLET | ORAL | Status: DC | PRN

## 2018-01-01 MED ORDER — TERBUTALINE SULFATE 1 MG/ML IJ SOLN
0.2500 mg | Freq: Once | INTRAMUSCULAR | Status: DC | PRN
  Filled 2018-01-01: qty 1

## 2018-01-01 MED ORDER — LACTATED RINGERS IV SOLN
INTRAVENOUS | Status: DC
  Administered 2018-01-01 (×3): via INTRAVENOUS

## 2018-01-01 MED ORDER — COCONUT OIL OIL: 1.0000 "application " | TOPICAL_OIL | Status: DC | PRN

## 2018-01-01 MED ORDER — DIPHENHYDRAMINE HCL 50 MG/ML IJ SOLN: 12.5000 mg | INTRAMUSCULAR | Status: DC | PRN

## 2018-01-01 MED ORDER — OXYTOCIN 40 UNITS IN LACTATED RINGERS INFUSION - SIMPLE MED: 2.5000 [IU]/h | INTRAVENOUS | Status: DC

## 2018-01-01 MED ORDER — PENICILLIN G POT IN DEXTROSE 60000 UNIT/ML IV SOLN
3.0000 10*6.[IU] | INTRAVENOUS | Status: DC
  Administered 2018-01-01: 3 10*6.[IU] via INTRAVENOUS
  Filled 2018-01-01 (×5): qty 50

## 2018-01-01 MED ORDER — OXYCODONE-ACETAMINOPHEN 5-325 MG PO TABS: 1.0000 | ORAL_TABLET | ORAL | Status: DC | PRN

## 2018-01-01 MED ORDER — LABETALOL HCL 5 MG/ML IV SOLN
INTRAVENOUS | Status: AC
  Administered 2018-01-01: 20 mg via INTRAVENOUS
  Filled 2018-01-01: qty 4

## 2018-01-01 MED ORDER — ONDANSETRON HCL 4 MG/2ML IJ SOLN: 4.0000 mg | Freq: Four times a day (QID) | INTRAMUSCULAR | Status: DC | PRN

## 2018-01-01 MED ORDER — LABETALOL HCL 5 MG/ML IV SOLN
20.0000 mg | INTRAVENOUS | Status: DC | PRN
  Administered 2018-01-01: 20 mg via INTRAVENOUS
  Filled 2018-01-01: qty 8

## 2018-01-01 MED ORDER — LABETALOL HCL 100 MG PO TABS
100.0000 mg | ORAL_TABLET | Freq: Two times a day (BID) | ORAL | Status: DC
  Administered 2018-01-01: 100 mg via ORAL
  Filled 2018-01-01: qty 1

## 2018-01-01 MED ORDER — MEASLES, MUMPS & RUBELLA VAC ~~LOC~~ INJ: 0.5000 mL | INJECTION | Freq: Once | SUBCUTANEOUS | Status: DC

## 2018-01-01 MED ORDER — MISOPROSTOL 25 MCG QUARTER TABLET
25.0000 ug | ORAL_TABLET | ORAL | Status: DC
  Administered 2018-01-01 (×2): 25 ug via VAGINAL
  Filled 2018-01-01: qty 1

## 2018-01-01 MED ORDER — MEDROXYPROGESTERONE ACETATE 150 MG/ML IM SUSP: 150.0000 mg | INTRAMUSCULAR | Status: DC | PRN

## 2018-01-01 MED ORDER — LEVOTHYROXINE SODIUM 125 MCG PO TABS
125.0000 ug | ORAL_TABLET | Freq: Every day | ORAL | Status: DC
  Administered 2018-01-02 – 2018-01-03 (×2): 125 ug via ORAL
  Filled 2018-01-01 (×3): qty 1

## 2018-01-01 MED ORDER — MISOPROSTOL 200 MCG PO TABS
ORAL_TABLET | ORAL | Status: AC
  Administered 2018-01-01: 800 ug via RECTAL
  Filled 2018-01-01: qty 4

## 2018-01-01 MED ORDER — WITCH HAZEL-GLYCERIN EX PADS: 1.0000 "application " | MEDICATED_PAD | CUTANEOUS | Status: DC | PRN

## 2018-01-01 MED ORDER — PHENYLEPHRINE 40 MCG/ML (10ML) SYRINGE FOR IV PUSH (FOR BLOOD PRESSURE SUPPORT)
80.0000 ug | PREFILLED_SYRINGE | INTRAVENOUS | Status: DC | PRN
  Filled 2018-01-01: qty 5

## 2018-01-01 MED ORDER — FENTANYL 2.5 MCG/ML BUPIVACAINE 1/10 % EPIDURAL INFUSION (WH - ANES)
14.0000 mL/h | INTRAMUSCULAR | Status: DC | PRN
  Filled 2018-01-01: qty 100

## 2018-01-01 MED ORDER — LIDOCAINE HCL (PF) 1 % IJ SOLN
30.0000 mL | INTRAMUSCULAR | Status: AC | PRN
  Administered 2018-01-01: 30 mL via SUBCUTANEOUS
  Filled 2018-01-01: qty 30

## 2018-01-01 MED ORDER — OXYTOCIN BOLUS FROM INFUSION
500.0000 mL | Freq: Once | INTRAVENOUS | Status: AC
  Administered 2018-01-01: 500 mL via INTRAVENOUS

## 2018-01-01 MED ORDER — LABETALOL HCL 100 MG PO TABS
100.0000 mg | ORAL_TABLET | Freq: Two times a day (BID) | ORAL | Status: DC
  Administered 2018-01-01 – 2018-01-03 (×4): 100 mg via ORAL
  Filled 2018-01-01 (×4): qty 1

## 2018-01-01 MED ORDER — ZOLPIDEM TARTRATE 5 MG PO TABS
5.0000 mg | ORAL_TABLET | Freq: Every evening | ORAL | Status: DC | PRN
Start: 2018-01-01 — End: 2018-01-03

## 2018-01-01 MED ORDER — SOD CITRATE-CITRIC ACID 500-334 MG/5ML PO SOLN
30.0000 mL | ORAL | Status: DC | PRN
  Filled 2018-01-01: qty 15

## 2018-01-01 MED ORDER — SIMETHICONE 80 MG PO CHEW
80.0000 mg | CHEWABLE_TABLET | ORAL | Status: DC | PRN
  Administered 2018-01-02: 80 mg via ORAL
  Filled 2018-01-01: qty 1

## 2018-01-01 NOTE — Progress Notes (Signed)
Labetalol held at this time per MD Rana SnareLowe due to improving blood pressures.

## 2018-01-01 NOTE — H&P (Signed)
Brandy Pierce is a 32 y.o. female presenting for IOL due to [facility] med[facility].  GBS+. OB History    Gravida Para Term Preterm AB Living   3 1 1  0 1 1   SAB TAB Ectopic Multiple Live Births   0 1 0   1     Past Medical History:  Diagnosis Date  . ADHD   . Anxiety   . Hypertension   . Hypothyroidism   . Obesity   . Polycystic disease, ovaries   . Vaginal Pap smear, abnormal    Past Surgical History:  Procedure Laterality Date  . COLPOSCOPY    . NO PAST SURGERIES     Family History: family history includes Alcohol abuse in her paternal grandfather; Arthritis in her father; Cirrhosis in her paternal grandfather; Depression in her mother; Diabetes in her father; Heart disease in her paternal grandfather; Hypertension in her father and mother; Hypothyroidism in her sister; Mental illness in her mother. Social History:  reports that  has never smoked. she has never used smokeless tobacco. She reports that she uses drugs. Drug: Other-see comments. She reports that she does not drink alcohol.     Maternal Diabetes: No Genetic Screening: Normal Maternal Ultrasounds/Referrals: Normal Fetal Ultrasounds or other Referrals:  None Maternal Substance Abuse:  No Significant Maternal Medications:  None Significant Maternal Lab Results:  None Other Comments:  None  ROS History Dilation: 1 Effacement (%): Thick Station: -3 Exam by:: Rana SnareLowe, MD Blood pressure (!) 152/86, pulse 77, temperature 97.9 F (36.6 C), temperature source Oral, resp. rate 18, height 5\' 5"  (1.651 m), weight (!) 307 lb 4.8 oz (139.4 kg), last menstrual period 04/08/2017. Exam Physical Exam  Prenatal labs: ABO, Rh: --/--/A POS (03/12 0115) Antibody: NEG (03/12 0115) Rubella: Immune (08/31 0000) RPR: Nonreactive (08/31 0000)  HBsAg: Negative (08/31 0000)  HIV: Non-reactive (08/31 0000)  GBS: Positive (08/31 0000)   Assessment/Plan: IUP at 38 + weeks CHTN controlled on Labetalol for IOL S/P cytotec x 2.  Will  start Pitocin at 10 am AROM as soon as possible Clinically stable, no Gest HTN severe features   Jaryd Drew C 01/01/2018, 9:14 AM

## 2018-01-01 NOTE — Progress Notes (Signed)
Patient ID: Brandy Pierce, female   DOB: 11/04/1985, 32 y.o.   MRN: [id number] While checking on patient, the bleeding is significantly better, however, when she rolls to side will have a squirt of active bleeding about 50cc every half hour or so. Very difficult to assess fundus and cevix due to her size.  I proceeded with curettage again, in the delivery room, and retrieved 50 cc of old clot from lower segment of uterus, and otherwise it felt firm and no significant active bleeding. DL

## 2018-01-01 NOTE — Progress Notes (Signed)
Patient ID: Brandy Pierce, female   DOB: 09/21/1986, 32 y.o.   MRN: [id number] CTSP for PP atony bleeding.  Large clots not responding to massage, IV pit, Rectal cytotec 800mcg. Pt given IV fentanyl in delivery room and I was able to banjo currette until all clots retrieved from the uterus and the cervix and uterus palpated to firm with minimal bleeding.  1 gram IV TXA given as well. Total EBL to this point 2300cc.  Pt given IV bolus, T&C 2 U PRBCs, and stat CBC shows normal Plts and Hgb 11.3 DL

## 2018-01-01 NOTE — Anesthesia Pain Management Evaluation Note (Signed)
  CRNA Pain Management Visit Note  Patient: Brandy Pierce, 32 y.o., female  "Hello I am a [facility] of the [facility]. We have an anesthesia team available at all times to provide care throughout the hospital, including epidural management and anesthesia for C-section. I don't know your plan for the delivery whether it a natural birth, water birth, IV sedation, nitrous supplementation, doula or epidural, but we want to meet your pain goals."   1.Was your pain managed to your expectations on prior hospitalizations?   Yes   2.What is your expectation for pain management during this hospitalization?     Epidural and Nitrous Oxide  3.How can we help you reach that goal? epidural  Record the patient's initial score and the patient's pain goal.   Pain: 2  Pain Goal: 6 [facility] wants you to be able to say your pain was always managed very well.  Kennley Schwandt 01/01/2018

## 2018-01-01 NOTE — Progress Notes (Signed)
Explained to the patient that she is a high fall risk.  Asked her to not get up without help.  She has not been up as of yet.  As I was leaving the patients room she was getting out of the bed saying that she needs to go to the bathroom.  I explained to her and her mother again that she needs to get up with the help of staff and she will need non slid socks and we are going to use the steady to assist her up.  I offered her the bedpan again, and told her to eat her dinner that is on the way up and then we will go to the bathroom together.

## 2018-01-02 LAB — CBC
HCT: 23.1 % — ABNORMAL LOW (ref 36.0–46.0)
Hemoglobin: 7.9 g/dL — ABNORMAL LOW (ref 12.0–15.0)
MCH: 28.9 pg (ref 26.0–34.0)
MCHC: 34.2 g/dL (ref 30.0–36.0)
MCV: 84.6 fL (ref 78.0–100.0)
PLATELETS: 214 10*3/uL (ref 150–400)
RBC: 2.73 MIL/uL — AB (ref 3.87–5.11)
RDW: 14.7 % (ref 11.5–15.5)
WBC: 13.9 10*3/uL — ABNORMAL HIGH (ref 4.0–10.5)

## 2018-01-02 NOTE — Lactation Note (Signed)
This note was copied from a baby's chart. Lactation Consultation Note Baby 13 hrs old. Fussing at times. Gets on breast, doesn't want to feed. Just hold nipple in mouth. Explained newborn feeding habits, STS, I&O, cluster feeding, breast compression, supply and demand.  Mom has tubular breast. Rt. Nipple at the bottom end of breast. Everted short shaft. Lt. Nipple shorter shaft at bottom end of breast. Mom stated Lt. Nipple was inverted. LC explained inverted nipples. Mom stated when not stimulated nipple sinks in and has crease.  Mom trying to hand express, unable to obtain colostrum. Mom massaging breast. LC instruct mom how to hand express w/o touching mom. Mom doesn't want her breast touched by anyone.  Answered questions, encouraged mom to assess breast before and after BF for transfer.  Mom stated she tried to BF her first child and wouldn't let anyone help or talk to her about it, and she didn't succeed. Mom would like to try this time w/help. Mom has Medela at home. Hand pump given to pre-pump prior to latching. Shells given to wear.  Talked mom through football position, support, body alignment. Talked mom through latching, "C" hold, obtaining deep latch, getting baby to open wide. Chin tug, breast massage.  Will ask RN to set up DEBP d/t PCOS, Hypothyroidism. Mom knows to pump q3h for 15-20 min. Mom encouraged to feed baby 8-12 times/24 hours and with feeding cues.  WH/LC brochure given w/resources, support groups and LC services.   Patient Name: Brandy Pierce'VToday's Date: 01/02/2018 Reason for consult: Initial assessment;Maternal endocrine disorder Type of Endocrine Disorder?: PCOS   Maternal Data Has patient been taught Hand Expression?: Yes Does the patient have breastfeeding experience prior to this delivery?: Yes  Feeding Feeding Type: Breast Fed Length of feed: 0 min  LATCH Score Latch: Too sleepy or reluctant, no latch achieved, no sucking elicited.  Audible  Swallowing: None  Type of Nipple: Flat(mom states Lt. nipple inverted)  Comfort (Breast/Nipple): Soft / non-tender  Hold (Positioning): Assistance needed to correctly position infant at breast and maintain latch.(instruction only. mom doesn't want to be touched)  LATCH Score: 4  Interventions Interventions: Breast feeding basics reviewed;Support pillows;Assisted with latch;Position options;Skin to skin;Breast massage;Shells;Pre-pump if needed;Hand pump;Breast compression;Adjust position  Lactation Tools Discussed/Used Tools: Shells;Pump Shell Type: Inverted Breast pump type: Manual WIC Program: No Pump Review: Setup, frequency, and cleaning;Milk Storage Initiated by:: Peri JeffersonL. Carlicia Leavens RN IBCLC Date initiated:: 01/02/18   Consult Status Consult Status: Follow-up Date: 01/02/18 Follow-up type: In-patient    Mando Blatz, Diamond NickelLAURA G 01/02/2018, 2:25 AM

## 2018-01-02 NOTE — Lactation Note (Signed)
This note was copied from a baby's chart. Lactation Consultation Note  Patient Name: Boy Bearl Mulberryrlene Cheramie XBJYN'WToday's Date: 01/02/2018 Reason for consult: Follow-up assessment Type of Endocrine Disorder?: Thyroid  PPH 2300 EBL Chronic HTN  Visited with P2 Mom of 29 hr old baby.  Baby is starting to latch more, but not opening his mouth widely per Mom and bedside RN.  No swallows identified while on breast.  Mom has been pumping using the DEBP, and doing some hand expression and breast massage.  Unable to see any colostrum drops.   Mom reports no breast changes with this pregnancy.  Breasts pendulous and heavy.  Mom has treated hypothyroidism and chronic hypertension. Baby latched in football hold, and sucked sleepily for 10 mins.  Unable to identify any swallows.  Talked to Mom and FOB about concern due to PPH, no breast changes, and ? Milk supply with first baby.  Recommended formula supplementation.  Demonstrated how to use 105FR feeding tube at the breast.  Mom became more vigorous, Mom complaining of uterine cramping during feeding, baby took 9 ml at first feeding.    Mom very uncomfortable from delivery, and sleepy from no sleepy and blood loss.    Encouraged STS, feeding baby on cue, adding 7-12 ml of formula+/EBM to breastfeeding using the feeding tube.  To ask her nurse for assistance.  Encouraged Mom to double pump after breast feeding on initiation setting, both breasts.  To ask for help prn.   Interventions Interventions: Breast feeding basics reviewed;Assisted with latch;Skin to skin;Breast massage;Hand express;Breast compression;Adjust position;Support pillows;Position options;DEBP  Lactation Tools Discussed/Used Tools: Pump;105F feeding tube / Syringe Breast pump type: Double-Electric Breast Pump Pump Review: Setup, frequency, and cleaning;Milk Storage   Consult Status Consult Status: Follow-up Date: 01/03/18 Follow-up type: In-patient    Judee ClaraSmith, Najee Cowens E 01/02/2018, 6:46  PM

## 2018-01-02 NOTE — Progress Notes (Signed)
Post Partum Day 1 Subjective: no complaints, up ad lib, voiding, tolerating PO and No dizziness  SOB or weakness, lochia minimal  Objective: Blood pressure (!) 146/75, pulse 85, temperature 98.1 F (36.7 C), temperature source Oral, resp. rate 18, height 5\' 5"  (1.651 m), weight (!) 139.4 kg (307 lb 4.8 oz), last menstrual period 04/08/2017, SpO2 99 %, unknown if currently breastfeeding.  Physical Exam:  General: alert, cooperative and appears stated age Lochia: appropriate Uterine Fundus: firm Incision: N/A DVT Evaluation: No evidence of DVT seen on physical exam. Negative Homan's sign. No cords or calf tenderness. No significant calf/ankle edema.  Recent Labs    01/01/18 1353 01/02/18 0546  HGB 11.3* 7.9*  HCT 34.2* 23.1*    Assessment/Plan: Plan for discharge tomorrow, Breastfeeding and Circumcision prior to discharge - Dr. Rana SnareLowe to do this AM per pt request.  Delivery c/b fetal bradycardia requiring VAVD with subsequent PPH s/s presumed abruption followed by uterine atony. Atony minimally responsive to uterotonics requiring TX acid and curettage of uterus. Total EBL 2300cc. This AM AFVSS, lochia minimal, hgb 7.9, no s/s of anemia, ff. CTM closely - ss/ of anemia reviewed with patient. Plan CBC tomorrow if becomes symptomatic. cHTN c/w Labetalol.     LOS: 1 day   Ranae Pilalise Jennifer Alphus Zeck 01/02/2018, 8:53 AM

## 2018-01-02 NOTE — Progress Notes (Signed)
CSW acknowledges consult.  CSW attempted to meet with MOB, however MOB was asleep.  CSW will attempt to visit with MOB at a later time.   Araina Butrick Boyd-Gilyard, MSW, LCSW Clinical Social Work [phone]  

## 2018-01-03 LAB — CBC
HCT: 26.7 % — ABNORMAL LOW (ref 36.0–46.0)
HEMATOCRIT: 19.4 % — AB (ref 36.0–46.0)
Hemoglobin: 6.6 g/dL — CL (ref 12.0–15.0)
Hemoglobin: 9 g/dL — ABNORMAL LOW (ref 12.0–15.0)
MCH: 29.2 pg (ref 26.0–34.0)
MCH: 28.8 pg (ref 26.0–34.0)
MCHC: 34 g/dL (ref 30.0–36.0)
MCHC: 33.7 g/dL (ref 30.0–36.0)
MCV: 85.8 fL (ref 78.0–100.0)
MCV: 85.3 fL (ref 78.0–100.0)
PLATELETS: 195 10*3/uL (ref 150–400)
Platelets: 195 10*3/uL (ref 150–400)
RBC: 2.26 MIL/uL — ABNORMAL LOW (ref 3.87–5.11)
RBC: 3.13 MIL/uL — AB (ref 3.87–5.11)
RDW: 14.9 % (ref 11.5–15.5)
RDW: 14.8 % (ref 11.5–15.5)
WBC: 10.7 10*3/uL — ABNORMAL HIGH (ref 4.0–10.5)
WBC: 11.5 10*3/uL — ABNORMAL HIGH (ref 4.0–10.5)

## 2018-01-03 MED ORDER — DIPHENHYDRAMINE HCL 25 MG PO CAPS: 25.0000 mg | ORAL_CAPSULE | Freq: Once | ORAL | Status: DC

## 2018-01-03 MED ORDER — FERROUS GLUCONATE 324 (38 FE) MG PO TABS: 324.0000 mg | ORAL_TABLET | Freq: Two times a day (BID) | ORAL | 3 refills | Status: AC

## 2018-01-03 MED ORDER — IBUPROFEN 600 MG PO TABS: 600.0000 mg | ORAL_TABLET | Freq: Four times a day (QID) | ORAL | 0 refills | Status: AC

## 2018-01-03 MED ORDER — OXYCODONE-ACETAMINOPHEN 5-325 MG PO TABS: 1.0000 | ORAL_TABLET | ORAL | 0 refills | Status: AC | PRN

## 2018-01-03 MED ORDER — SODIUM CHLORIDE 0.9 % IV SOLN
Freq: Once | INTRAVENOUS | Status: AC
  Administered 2018-01-03: 10:00:00 via INTRAVENOUS

## 2018-01-03 NOTE — Lactation Note (Addendum)
This note was copied from a baby's chart. Lactation Consultation Note  Patient Name: Brandy Pierce JYNWG'NToday's Date: 01/03/2018 Reason for consult: Follow-up assessment Type of Endocrine Disorder?: PCOS  LC note prior to discharge:  Mother receiving blood before being discharged later today.  Reviewed plan of care once mother gets home.  Plan to include pre-pump if necessary, breastfeed, 5FR SNS with family help and support.  Mother states she can post pump 4-6 times a day to increase milk volume.  She has an electric pump for home use.  Mother stated that she was very pleased with the plan that the previous LC set up.  The LC reviewed engorgement prevention and treatment.  Mom instructed to supplement until the milk comes in ([facility]).  Mom in agreement.Mom made aware of O/P services, breastfeeding support groups, community resources, and our phone # for post-discharge questions. Father and maternal grandmother present and active support.  Father told LC that they would be [facility]   Maternal Data Has patient been taught Hand Expression?: Yes  Feeding Feeding Type: Breast Milk with Formula added Length of feed: 20 min  LATCH Score                   Interventions Interventions: Breast feeding basics reviewed  Lactation Tools Discussed/Used Tools: Supplemental Nutrition System   Consult Status Consult Status: Complete Date: 01/03/18    Irene PapBeth R Sinia Antosh 01/03/2018, 2:16 PM

## 2018-01-03 NOTE — Progress Notes (Signed)
Pt dizzy, light headed, slight HA. Obtained CBC hgb 6.6. Notified Dr. Elon SpannerLeger, she said rounding physician would speak with pt about blood transfusion. Informed pt and pt's family for her to not get up without assistance.

## 2018-01-03 NOTE — Discharge Summary (Signed)
Obstetric Discharge Summary Reason for Admission: induction of labor Prenatal Procedures: NST and chronic HTN Intrapartum Procedures: spontaneous vaginal delivery Postpartum Procedures: D and C/ transfusion Complications-Operative and Postpartum: PPH requiring transfusion Hemoglobin  Date Value Ref Range Status  01/03/2018 6.6 (LL) 12.0 - 15.0 g/dL Final    Comment:    REPEATED TO VERIFY CRITICAL RESULT CALLED TO, READ BACK BY AND VERIFIED WITH: BOSTIC,H. @0619  ON 01/03/2018 BY BOVELL,A.    HCT  Date Value Ref Range Status  01/03/2018 19.4 (L) 36.0 - 46.0 % Final    Physical Exam:  General: alert Lochia: appropriate Uterine Fundus: firm Incision: healing well DVT Evaluation: No evidence of DVT seen on physical exam.  Discharge Diagnoses: Term Pregnancy-delivered and chronic HTN, PPH req transfusion  Discharge Information: Date: 01/03/2018 Activity: pelvic rest Diet: routine Medications: PNV, Ibuprofen, Iron and Percocet Condition: stable Instructions: refer to practice specific booklet Discharge to: home Follow-up Information    [place] up.   Why:  office will call to check 1 week Contact information: [address] [place] [address] [phone]           Newborn Data: Live born female  Birth Weight: 8 lb 7.1 oz (3830 g) APGAR: 7, 9  Newborn Delivery   Birth date/time:  01/01/2018 13:00:00 Delivery type:  Vaginal, Vacuum (Extractor)     Home with mother   2 U PRBC today before D/C.  Nicholson Starace Milana ObeyM Hildur Bayer 01/03/2018, 8:58 AM

## 2018-01-04 LAB — BPAM RBC
BLOOD PRODUCT EXPIRATION DATE: 201903242359
Blood Product Expiration Date: 201903232359
ISSUE DATE / TIME: 201903141018
ISSUE DATE / TIME: 201903141306
Unit Type and Rh: 6200
Unit Type and Rh: 6200

## 2018-01-04 LAB — TYPE AND SCREEN
ABO/RH(D): A POS
ANTIBODY SCREEN: NEGATIVE
UNIT DIVISION: 0
Unit division: 0

## 2018-01-08 NOTE — Progress Notes (Signed)
Late Entry: CSW received consult for hx of Anxiety and Depression.  CSW met with MOB and FOB to offer support and complete assessment.   MOB appeared tired, but was pleasant and welcoming of CSW's visit.  She states permission to speak openly with FOB present.  FOB was tending to baby and involved in the conversation. MOB states she is doing well at this time, but was very open about her delivery experience and states that she feels grateful to be alive after experiencing a postpartum hemorrhage.  MOB told CSW that FOB's mother died in childbirth with him.  We talked at length about her experience, giving space for both parents to share their thoughts and begin to process their feelings.  CSW discussed the prevalence of PTSD any time there is the fear of loss of life.  CSW noted that parents may have emotions related to their birth experience triggered by certain events and resurface at various times in the future.  CSW recommends talking about these feelings and suggests speaking with a counselor if parents feel these emotions interfere with daily life at any point.  MOB seemed to feel appreciative and validated after having this conversation with CSW and agrees to monitor her emotions and stay in communication with FOB regarding their experience and feelings associated. 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  She states she began treatment at [facility] and feels her symptoms were mostly anger directly at unsuccessful breast feeding.  She states their situation was unstable at the time and that her first pregnancy was not planned.  Parents report feeling much better about moving into this postpartum time period given their current stability, including jobs and health insurance and the fact that this pregnancy was planned.  MOB commits to talking with  her MD if she has concerns at any time.  She states they have decided they will not be having any more children and that FOB will be getting a vasectomy.  He is in agreement.   CSW provided education regarding the baby blues period vs. perinatal mood disorders, discussed treatment and gave resources for mental health follow up if concerns arise.  CSW recommends self-evaluation during the postpartum time period using the New Mom Checklist from Postpartum Progress as well as the [place] Postnatal Depression Scale and discussed how FOB's can be affected by symptoms as well. CSW provided review of Sudden Infant Death Syndrome (SIDS) precautions.   CSW identifies no further need for intervention and no barriers to discharge at this time.

## 2018-03-06 ENCOUNTER — Telehealth: Payer: Self-pay | Admitting: *Deleted

## 2018-03-06 NOTE — Telephone Encounter (Signed)
Received  A form for medical clearance for conscious sedation for dental procedure. Contacted dental office they state patient scheduled for this procedure 03/29/18.let them know we would need to see patient to evaluate for clearance. Called patient let message for her to call and schedule an appointment for medical clearance.

## 2018-03-26 IMAGING — US US OB COMP LESS 14 WK
1 series · 15 of 28 positions shown · non-contrast
Comparison: None.

CLINICAL DATA: Pregnant patient with bleeding

EXAM:
OBSTETRIC <14 WK ULTRASOUND
TECHNIQUE: Transabdominal ultrasound was performed for evaluation of the
gestation as well as the maternal uterus and adnexal regions.

[Series 1: us ob comp less 14 wk · 15 of 33 slices shown]
[im 1/33]
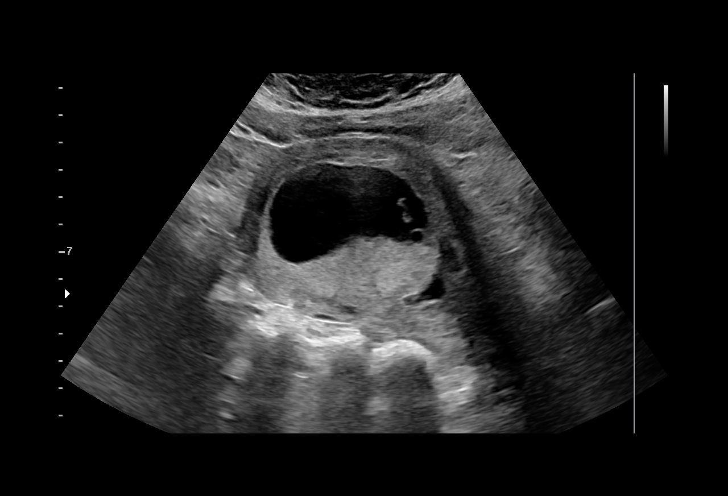
[im 3/33]
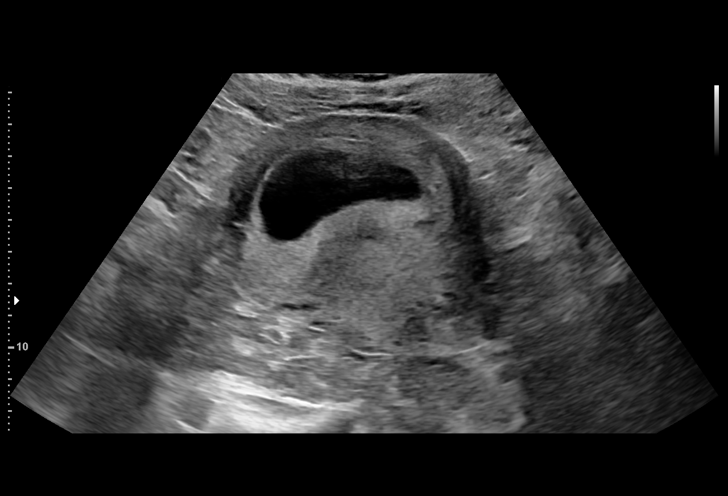
[im 5/33]
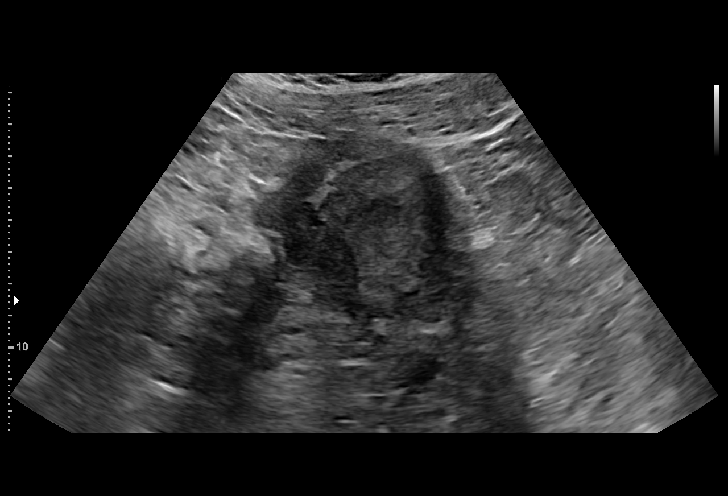
[im 8/33]
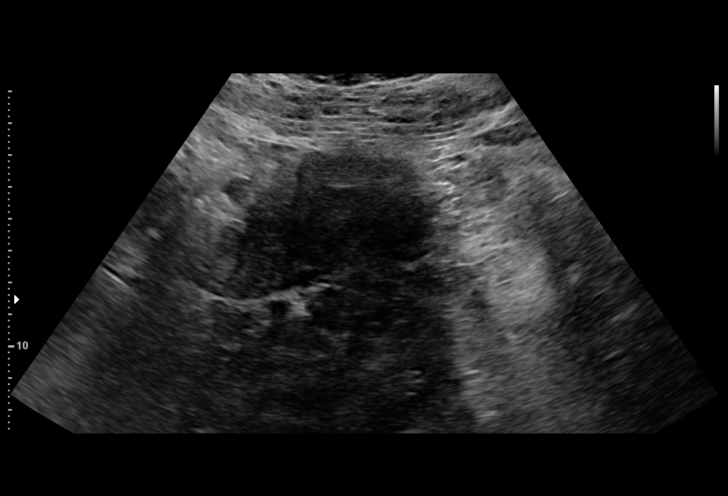
[im 10/33]
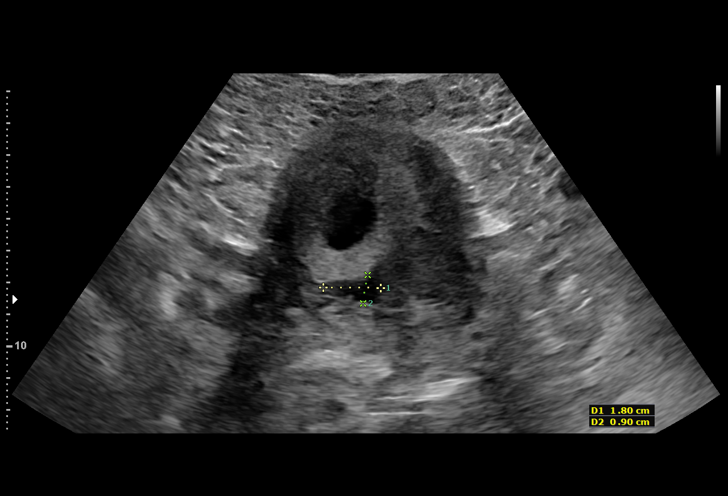
[im 12/33]
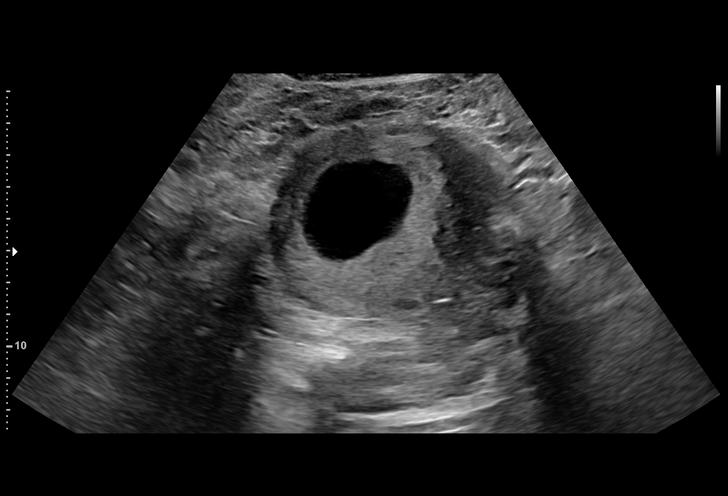
[im 15/33]
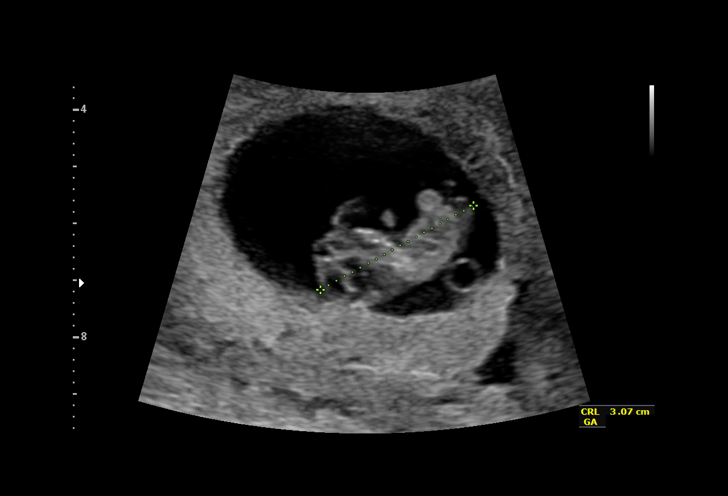
[im 17/33]
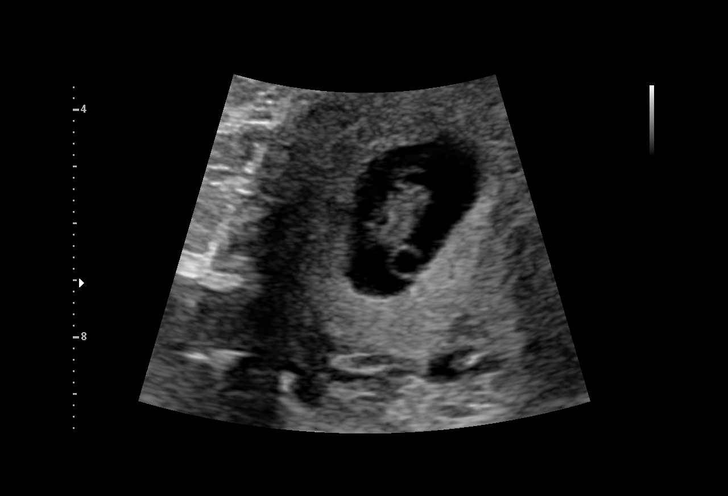
[im 18/33]
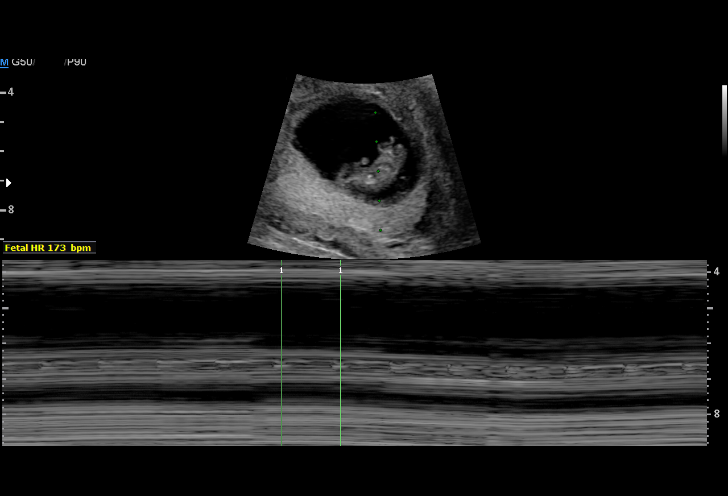
[im 21/33]
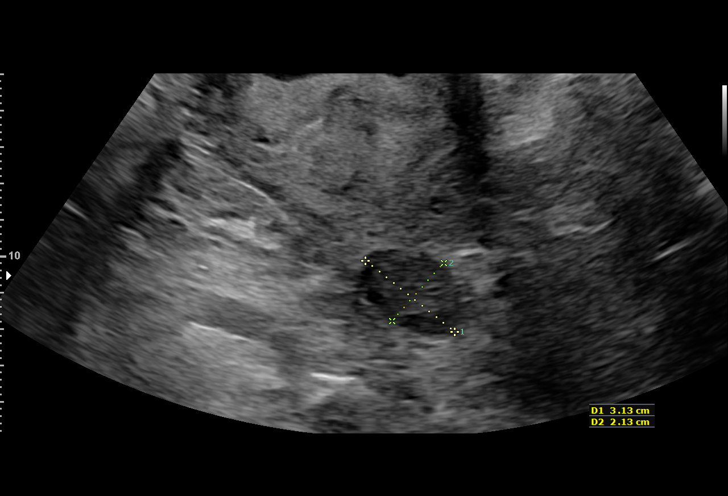
[im 23/33]
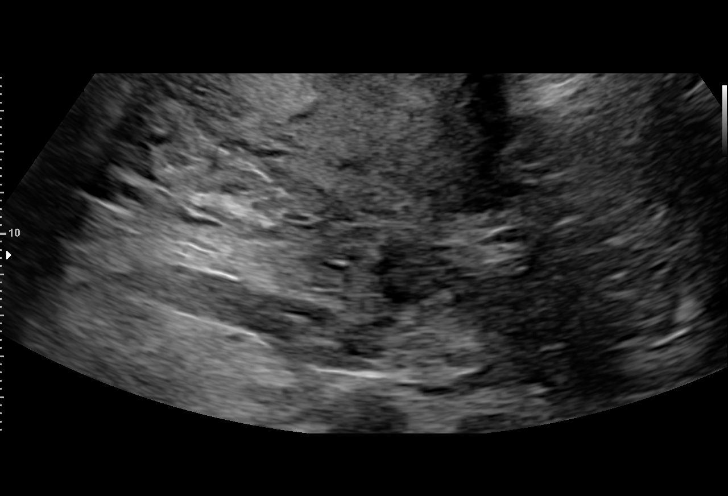
[im 25/33]
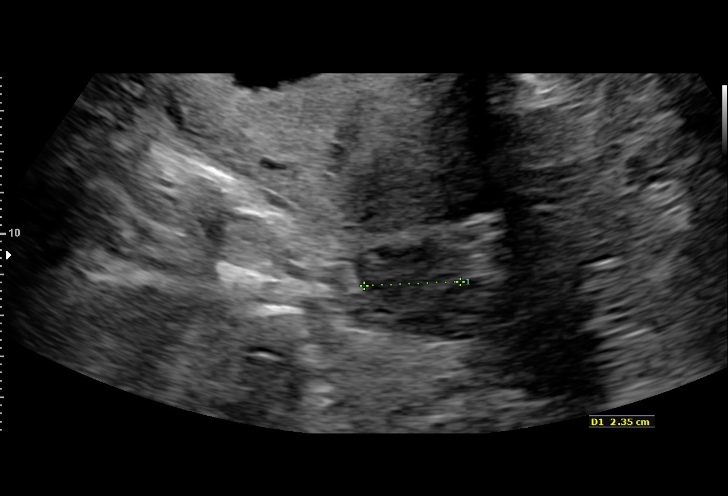
[im 28/33]
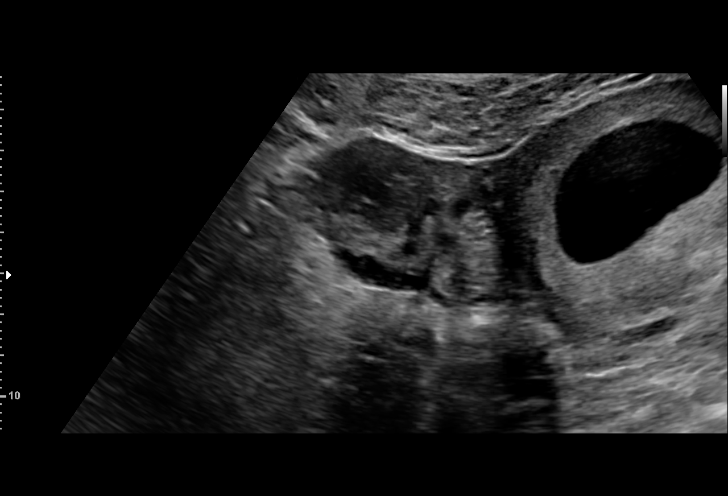
[im 30/33]
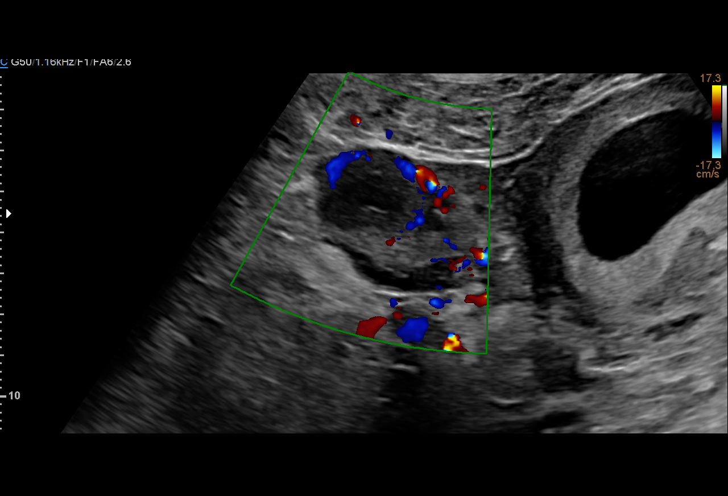
[im 33/33]
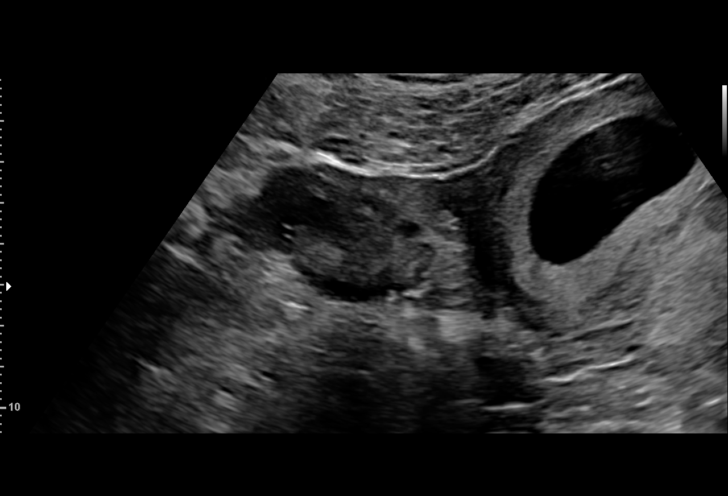

[15 of 28 positions shown; findings below may reference images not displayed]

FINDINGS: Intrauterine gestational sac: Single

Yolk sac:  Visualized.

Embryo:  Visualized.

Cardiac Activity: Visualized.

Heart Rate: 173 bpm

MSD:   mm    w     d

CRL:   30.2  mm   9 w 6 d                  US EDC: January 11, 2018

Subchorionic hemorrhage: There is a small inferior subchorionic
hemorrhage.

Maternal uterus/adnexae: Normal ovaries.
IMPRESSION: 1. Single live IUP.  Small subchorionic hemorrhage.
2. No other abnormalities.

## 2018-04-10 ENCOUNTER — Inpatient Hospital Stay (HOSPITAL_COMMUNITY): Payer: Managed Care, Other (non HMO) | Admitting: Anesthesiology

## 2018-04-10 ENCOUNTER — Ambulatory Visit (HOSPITAL_COMMUNITY)
Admission: AD | Admit: 2018-04-10 | Discharge: 2018-04-11 | Disposition: A | Discharge status: Home / Self Care | Payer: Managed Care, Other (non HMO) | Source: Ambulatory Visit | Attending: Obstetrics and Gynecology | Admitting: Obstetrics and Gynecology

## 2018-04-10 ENCOUNTER — Other Ambulatory Visit: Payer: Self-pay

## 2018-04-10 ENCOUNTER — Inpatient Hospital Stay (HOSPITAL_COMMUNITY): Payer: Managed Care, Other (non HMO)

## 2018-04-10 ENCOUNTER — Encounter (HOSPITAL_COMMUNITY)
Admission: AD | Disposition: A | Discharge status: Home / Self Care | Payer: Self-pay | Source: Ambulatory Visit | Attending: Obstetrics and Gynecology

## 2018-04-10 ENCOUNTER — Encounter (HOSPITAL_COMMUNITY): Payer: Self-pay | Admitting: *Deleted

## 2018-04-10 DIAGNOSIS — N920 Excessive and frequent menstruation with regular cycle: Secondary | ICD-10-CM

## 2018-04-10 DIAGNOSIS — O034 Incomplete spontaneous abortion without complication: Secondary | ICD-10-CM | POA: Insufficient documentation

## 2018-04-10 DIAGNOSIS — I10 Essential (primary) hypertension: Secondary | ICD-10-CM | POA: Diagnosis not present

## 2018-04-10 DIAGNOSIS — Z79899 Other long term (current) drug therapy: Secondary | ICD-10-CM | POA: Diagnosis not present

## 2018-04-10 DIAGNOSIS — N939 Abnormal uterine and vaginal bleeding, unspecified: Secondary | ICD-10-CM

## 2018-04-10 DIAGNOSIS — Z9889 Other specified postprocedural states: Secondary | ICD-10-CM | POA: Diagnosis not present

## 2018-04-10 DIAGNOSIS — Z79890 Hormone replacement therapy (postmenopausal): Secondary | ICD-10-CM | POA: Diagnosis not present

## 2018-04-10 DIAGNOSIS — E039 Hypothyroidism, unspecified: Secondary | ICD-10-CM | POA: Insufficient documentation

## 2018-04-10 HISTORY — PX: DILATION AND EVACUATION: SHX1459

## 2018-04-10 LAB — CBC
HCT: 41.1 % (ref 36.0–46.0)
Hemoglobin: 14.2 g/dL (ref 12.0–15.0)
MCH: 29.3 pg (ref 26.0–34.0)
MCHC: 34.5 g/dL (ref 30.0–36.0)
MCV: 84.7 fL (ref 78.0–100.0)
Platelets: 250 10*3/uL (ref 150–400)
RBC: 4.85 MIL/uL (ref 3.87–5.11)
RDW: 12.5 % (ref 11.5–15.5)
WBC: 8 10*3/uL (ref 4.0–10.5)

## 2018-04-10 LAB — COMPREHENSIVE METABOLIC PANEL
ALT: 54 U/L (ref 14–54)
AST: 44 U/L — ABNORMAL HIGH (ref 15–41)
Albumin: 4.6 g/dL (ref 3.5–5.0)
Alkaline Phosphatase: 34 U/L — ABNORMAL LOW (ref 38–126)
Anion gap: 11 (ref 5–15)
BUN: 14 mg/dL (ref 6–20)
CO2: 22 mmol/L (ref 22–32)
Calcium: 9.3 mg/dL (ref 8.9–10.3)
Chloride: 104 mmol/L (ref 101–111)
Creatinine, Ser: 0.97 mg/dL (ref 0.44–1.00)
GFR calc Af Amer: >60 mL/min (ref >60)
GFR calc non Af Amer: >60 mL/min (ref >60)
Glucose, Bld: 121 mg/dL — ABNORMAL HIGH (ref 65–99)
Potassium: 4.3 mmol/L (ref 3.5–5.1)
Sodium: 137 mmol/L (ref 135–145)
Total Bilirubin: 0.7 mg/dL (ref 0.3–1.2)
Total Protein: 7.4 g/dL (ref 6.5–8.1)

## 2018-04-10 LAB — TYPE AND SCREEN
ABO/RH(D): A POS
Antibody Screen: NEGATIVE

## 2018-04-10 SURGERY — DILATION AND EVACUATION, UTERUS
Anesthesia: Monitor Anesthesia Care | Site: Vagina

## 2018-04-10 MED ORDER — FENTANYL CITRATE (PF) 100 MCG/2ML IJ SOLN
INTRAMUSCULAR | Status: DC | PRN
  Administered 2018-04-10 (×4): 50 ug via INTRAVENOUS

## 2018-04-10 MED ORDER — FENTANYL CITRATE (PF) 100 MCG/2ML IJ SOLN
INTRAMUSCULAR | Status: AC
  Filled 2018-04-10: qty 2

## 2018-04-10 MED ORDER — PROMETHAZINE HCL 25 MG/ML IJ SOLN: 6.2500 mg | INTRAMUSCULAR | Status: DC | PRN

## 2018-04-10 MED ORDER — OXYCODONE HCL 5 MG/5ML PO SOLN: 5.0000 mg | Freq: Once | ORAL | Status: DC | PRN

## 2018-04-10 MED ORDER — LABETALOL HCL 5 MG/ML IV SOLN
20.0000 mg | Freq: Once | INTRAVENOUS | Status: AC
Start: 2018-04-10 — End: 2018-04-10
  Administered 2018-04-10: 20 mg via INTRAVENOUS
  Filled 2018-04-10: qty 4

## 2018-04-10 MED ORDER — PROPOFOL 10 MG/ML IV BOLUS
INTRAVENOUS | Status: AC
  Filled 2018-04-10: qty 20

## 2018-04-10 MED ORDER — FENTANYL CITRATE (PF) 100 MCG/2ML IJ SOLN
25.0000 ug | INTRAMUSCULAR | Status: DC | PRN
  Administered 2018-04-10 (×2): 25 ug via INTRAVENOUS
  Administered 2018-04-10: 50 ug via INTRAVENOUS

## 2018-04-10 MED ORDER — LIDOCAINE HCL (CARDIAC) PF 100 MG/5ML IV SOSY
PREFILLED_SYRINGE | INTRAVENOUS | Status: DC | PRN
  Administered 2018-04-10: 100 mg via INTRAVENOUS

## 2018-04-10 MED ORDER — KETOROLAC TROMETHAMINE 60 MG/2ML IM SOLN: 60.0000 mg | Freq: Once | INTRAMUSCULAR | Status: DC

## 2018-04-10 MED ORDER — MIDAZOLAM HCL 2 MG/2ML IJ SOLN
INTRAMUSCULAR | Status: AC
  Filled 2018-04-10: qty 2

## 2018-04-10 MED ORDER — KETOROLAC TROMETHAMINE 30 MG/ML IJ SOLN
INTRAMUSCULAR | Status: DC | PRN
  Administered 2018-04-10: 30 mg via INTRAVENOUS

## 2018-04-10 MED ORDER — KETOROLAC TROMETHAMINE 30 MG/ML IJ SOLN
INTRAMUSCULAR | Status: AC
  Filled 2018-04-10: qty 1

## 2018-04-10 MED ORDER — LIDOCAINE HCL (CARDIAC) PF 100 MG/5ML IV SOSY
PREFILLED_SYRINGE | INTRAVENOUS | Status: AC
  Filled 2018-04-10: qty 5

## 2018-04-10 MED ORDER — OXYCODONE HCL 5 MG PO TABS: 5.0000 mg | ORAL_TABLET | Freq: Once | ORAL | Status: DC | PRN

## 2018-04-10 MED ORDER — ACETAMINOPHEN 160 MG/5ML PO SOLN: 975.0000 mg | Freq: Once | ORAL | Status: DC

## 2018-04-10 MED ORDER — KETOROLAC TROMETHAMINE 30 MG/ML IJ SOLN
30.0000 mg | Freq: Once | INTRAMUSCULAR | Status: AC
  Administered 2018-04-10: 30 mg via INTRAVENOUS
  Filled 2018-04-10: qty 1

## 2018-04-10 MED ORDER — FAMOTIDINE IN NACL 20-0.9 MG/50ML-% IV SOLN
20.0000 mg | Freq: Once | INTRAVENOUS | Status: AC
  Administered 2018-04-10: 20 mg via INTRAVENOUS
  Filled 2018-04-10: qty 50

## 2018-04-10 MED ORDER — MIDAZOLAM HCL 2 MG/2ML IJ SOLN
INTRAMUSCULAR | Status: DC | PRN
  Administered 2018-04-10: 2 mg via INTRAVENOUS

## 2018-04-10 MED ORDER — LIDOCAINE-EPINEPHRINE 1 %-1:100000 IJ SOLN
INTRAMUSCULAR | Status: DC | PRN
  Administered 2018-04-10: 20 mL

## 2018-04-10 MED ORDER — PROPOFOL 10 MG/ML IV BOLUS
INTRAVENOUS | Status: DC | PRN
  Administered 2018-04-10: 40 mg via INTRAVENOUS
  Administered 2018-04-10: 160 mg via INTRAVENOUS

## 2018-04-10 MED ORDER — SODIUM CHLORIDE 0.9 % IV SOLN
2.0000 g | INTRAVENOUS | Status: AC
  Administered 2018-04-10: 2 g via INTRAVENOUS
  Filled 2018-04-10: qty 2

## 2018-04-10 MED ORDER — SOD CITRATE-CITRIC ACID 500-334 MG/5ML PO SOLN
30.0000 mL | Freq: Once | ORAL | Status: AC
  Administered 2018-04-10: 30 mL via ORAL
  Filled 2018-04-10: qty 15

## 2018-04-10 MED ORDER — LACTATED RINGERS IV SOLN
INTRAVENOUS | Status: DC
  Administered 2018-04-10 (×3): via INTRAVENOUS

## 2018-04-10 MED ORDER — ACETAMINOPHEN 160 MG/5ML PO SOLN
ORAL | Status: AC
  Filled 2018-04-10: qty 40.6

## 2018-04-10 SURGICAL SUPPLY — 17 items
CATH ROBINSON RED A/P 16FR (CATHETERS) ×3 IMPLANT
DECANTER SPIKE VIAL GLASS SM (MISCELLANEOUS) ×3 IMPLANT
GLOVE BIOGEL PI IND STRL 7.0 (GLOVE) ×1 IMPLANT
GLOVE BIOGEL PI INDICATOR 7.0 (GLOVE) ×2
GLOVE SURG ORTHO 8.0 STRL STRW (GLOVE) ×3 IMPLANT
GOWN STRL REUS W/TWL LRG LVL3 (GOWN DISPOSABLE) ×6 IMPLANT
KIT BERKELEY 1ST TRIMESTER 3/8 (MISCELLANEOUS) ×3 IMPLANT
NS IRRIG 1000ML POUR BTL (IV SOLUTION) ×3 IMPLANT
PACK VAGINAL MINOR WOMEN LF (CUSTOM PROCEDURE TRAY) ×3 IMPLANT
PAD OB MATERNITY 4.3X12.25 (PERSONAL CARE ITEMS) ×3 IMPLANT
PAD PREP 24X48 CUFFED NSTRL (MISCELLANEOUS) ×3 IMPLANT
SET BERKELEY SUCTION TUBING (SUCTIONS) ×3 IMPLANT
TOWEL OR 17X24 6PK STRL BLUE (TOWEL DISPOSABLE) ×6 IMPLANT
VACURETTE 10 RIGID CVD (CANNULA) IMPLANT
VACURETTE 7MM CVD STRL WRAP (CANNULA) IMPLANT
VACURETTE 8 RIGID CVD (CANNULA) ×3 IMPLANT
VACURETTE 9 RIGID CVD (CANNULA) IMPLANT

## 2018-04-10 NOTE — Anesthesia Preprocedure Evaluation (Signed)
Anesthesia Evaluation  Patient identified by MRN, date of birth, ID band Patient awake    Reviewed: Allergy & Precautions, NPO status , Patient's Chart, lab work & pertinent test results  Airway Mallampati: II  TM Distance: >3 FB Neck ROM: Full    Dental   Pulmonary neg pulmonary ROS,    breath sounds clear to auscultation       Cardiovascular hypertension, Pt. on medications  Rhythm:Regular Rate:Normal     Neuro/Psych Anxiety negative neurological ROS     GI/Hepatic negative GI ROS, Neg liver ROS,   Endo/Other  Hypothyroidism Morbid obesity  Renal/GU negative Renal ROS  negative genitourinary   Musculoskeletal negative musculoskeletal ROS (+)   Abdominal (+) + obese,   Peds  (+) ADHD Hematology negative hematology ROS (+)   Anesthesia Other Findings   Reproductive/Obstetrics                             Anesthesia Physical Anesthesia Plan  ASA: III  Anesthesia Plan: MAC   Post-op Pain Management:    Induction: Intravenous  PONV Risk Score and Plan: Propofol infusion and Treatment may vary due to age or medical condition  Airway Management Planned: Natural Airway and Simple Face Mask  Additional Equipment: None  Intra-op Plan:   Post-operative Plan:   Informed Consent: I have reviewed the patients History and Physical, chart, labs and discussed the procedure including the risks, benefits and alternatives for the proposed anesthesia with the patient or authorized representative who has indicated his/her understanding and acceptance.     Plan Discussed with: CRNA and Anesthesiologist  Anesthesia Plan Comments:         Anesthesia Quick Evaluation

## 2018-04-10 NOTE — MAU Note (Addendum)
Vag delivery 3/12.  Started bleeding yesterday morning,  Filling a pad every hour, picked up this morning.. Having really bad cramps.  Hemorrhaged after delivery, required transfusion.

## 2018-04-10 NOTE — Progress Notes (Signed)
Radiology @ bedside to perform ultrasound.

## 2018-04-10 NOTE — Anesthesia Procedure Notes (Signed)
Procedure Name: LMA Insertion Date/Time: 04/10/2018 9:51 PM Performed by: Trellis PaganiniBrewer, Audy Dauphine N, CRNA Patient Re-evaluated:Patient Re-evaluated prior to induction Oxygen Delivery Method: Circle system utilized Preoxygenation: Pre-oxygenation with 100% oxygen Induction Type: IV induction LMA Size: 4.0 Number of attempts: 1 Dental Injury: Teeth and Oropharynx as per pre-operative assessment

## 2018-04-10 NOTE — Transfer of Care (Signed)
Immediate Anesthesia Transfer of Care Note  Patient: Brandy Pierce  Procedure(s) Performed: DILATATION AND EVACUATION (N/A Vagina )  Patient Location: PACU  Anesthesia Type:General  Level of Consciousness: awake, alert , oriented and patient cooperative  Airway & Oxygen Therapy: Patient Spontanous Breathing  Post-op Assessment: Report given to RN and Post -op Vital signs reviewed and stable  Post vital signs: Reviewed and stable  Last Vitals:  Vitals Value Taken Time  BP 134/67 04/10/2018 10:13 PM  Temp    Pulse 74 04/10/2018 10:15 PM  Resp 14 04/10/2018 10:15 PM  SpO2 100 % 04/10/2018 10:15 PM  Vitals shown include unvalidated device data.  Last Pain:  Vitals:   04/10/18 1747  TempSrc:   PainSc: 1          Complications: No apparent anesthesia complications
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
## 2018-04-10 NOTE — Discharge Instructions (Signed)
DISCHARGE INSTRUCTIONS: D&C / D&E The following instructions have been prepared to help you care for yourself upon your return home.   Personal hygiene:  Use sanitary pads for vaginal drainage, not tampons.  Shower the day after your procedure.  NO tub baths, pools or Jacuzzis for 2-3 weeks.  Wipe front to back after using the bathroom.  Activity and limitations:  Do NOT drive or operate any equipment for 24 hours. The effects of anesthesia are still present and drowsiness may result.  Do NOT rest in bed all day.  Walking is encouraged.  Walk up and down stairs slowly.  You may resume your normal activity in one to two days or as indicated by your physician.  Sexual activity: NO intercourse for at least 2 weeks after the procedure, or as indicated by your physician.  Diet: Eat a light meal as desired this evening. You may resume your usual diet tomorrow.  Return to work: You may resume your work activities in one to two days or as indicated by your doctor.  What to expect after your surgery: Expect to have vaginal bleeding/discharge for 2-3 days and spotting for up to 10 days. It is not unusual to have soreness for up to 1-2 weeks. You may have a slight burning sensation when you urinate for the first day. Mild cramps may continue for a couple of days. You may have a regular period in 2-6 weeks.  Call your doctor for any of the following:  Excessive vaginal bleeding, saturating and changing one pad every hour.  Inability to urinate 6 hours after discharge from hospital.  Pain not relieved by pain medication.  Fever of 100.4 F or greater.  Unusual vaginal discharge or odor.   Call for an appointment:    Patients signature: ______________________  Nurses signature ________________________  Support person's signature_______________________   NO IBUPROFEN PRODUCTS (MOTRIN, ADVIL) OR ALEVE UNTIL 4:00AM 04/11/18

## 2018-04-10 NOTE — Brief Op Note (Signed)
04/10/2018  10:02 PM  PATIENT:  Brandy Pierce  32 y.o. female  PRE-OPERATIVE DIAGNOSIS:  retained products  POST-OPERATIVE DIAGNOSIS:  retained products  PROCEDURE:  Procedure(s): DILATATION AND EVACUATION (N/A)  SURGEON:  Surgeon(s) and Role:    * Candice CampLowe, Teliyah Royal, MD - Primary  PHYSICIAN ASSISTANT:   ASSISTANTS: none   ANESTHESIA:   local  EBL:      BLOOD ADMINISTERED:none  DRAINS: none   LOCAL MEDICATIONS USED:  MARCAINE     SPECIMEN:  Source of Specimen:  poss POC  DISPOSITION OF SPECIMEN:  PATHOLOGY  COUNTS:  YES  TOURNIQUET:  * No tourniquets in log *  DICTATION: .Other Dictation: Dictation Number 1  PLAN OF CARE: Discharge to home after PACU  PATIENT DISPOSITION:  PACU - hemodynamically stable.   Delay start of Pharmacological VTE agent (>24hrs) due to surgical blood loss or risk of bleeding: not applicable

## 2018-04-11 ENCOUNTER — Encounter (HOSPITAL_COMMUNITY): Payer: Self-pay | Admitting: Obstetrics and Gynecology

## 2018-04-11 NOTE — Anesthesia Postprocedure Evaluation (Signed)
Anesthesia Post Note  Patient: Brandy Pierce  Procedure(s) Performed: DILATATION AND EVACUATION (N/A Vagina )     Patient location during evaluation: PACU Anesthesia Type: General Level of consciousness: awake and alert Pain management: pain level controlled Vital Signs Assessment: post-procedure vital signs reviewed and stable Respiratory status: spontaneous breathing, nonlabored ventilation and respiratory function stable Cardiovascular status: blood pressure returned to baseline and stable Postop Assessment: no apparent nausea or vomiting Anesthetic complications: no    Last Vitals:  Vitals:   04/10/18 2315 04/10/18 2352  BP:  (!) 143/98  Pulse:  69  Resp:  18  Temp:  36.9 C  SpO2: 96% 99%    Last Pain:  Vitals:   04/10/18 2352  TempSrc:   PainSc: 3    Pain Goal:                 Brandy Pierce

## 2018-04-11 NOTE — Op Note (Signed)
NAME: [name], Brandy KRISTENE MEDICAL RECORD [id number] ACCOUNT [id number] DATE OF BIRTH:01-07-1986 FACILITY: [facility] LOCATION: [facility] PHYSICIAN:Kaydn Kumpf Clance Boll. Jaymar Loeber, MD  OPERATIVE REPORT  DATE OF PROCEDURE:  04/10/2018  PREOPERATIVE DIAGNOSIS:  Abnormal uterine bleeding and ultrasound consistent with retained products of conception.  POSTOPERATIVE DIAGNOSIS:  Abnormal uterine bleeding and ultrasound consistent with retained products of conception.  PROCEDURE:  Dilation and evacuation.  SURGEON:  Candice Campavid Prentiss Hammett, MD.  ANESTHESIA:  LMA.  INDICATIONS:  The patient is a 32 year old who [facility] with menorrhagia and abnormal uterine bleeding that began earlier today, now passing clots and going through 1 to 2 pads an hour.  Her postpartum delivery was complicated by  retained placenta requiring D and C and also blood transfusion afterwards.  She did not have any bleeding for approximately six weeks postpartum and began bleeding irregularly after her postpartum visit for which became extremely heavy today.  Ultrasound  evaluation shows a large area of tissue in the lower segment of the uterus.  The patient is stable hemodynamically and she is not anemic.  Plan to proceed with dilation and evacuation.  Risks and benefits were discussed at length.  Informed consent was  obtained.  DESCRIPTION OF PROCEDURE:  After adequate analgesia, the patient was placed in dorsal lithotomy position.  She was sterilely prepped and draped.  Bladder was sterilely drained.  A Graves speculum was placed.  Tenaculum placed on the anterior lip of the  cervix.  Paracervical block was placed, 1% Xylocaine 1:100,000 epinephrine.  A total of 20 mL used.  Uterus sounded to 8 cm.  It was already visually dilated so an 8 mm suction curette was inserted retrieving clots and question of products of conception.   A suction curettage was performed until a gritty surface was felt throughout the endometrial cavity with  no residual clots or tissue being removed and by palpation appeared to be clear.  The curette was removed.  There was no further bleeding was  noted.  Tenaculum removed from the cervix.  The patient was stable and transferred to recovery room.  Sponge and needle count was normal x3.  ESTIMATED BLOOD LOSS:  Minimal.  DISPOSITION:  The patient discharged home with followup in the office 2 to 3 weeks and with the routine instruction sheet for D and E.  She was given 2 grams of cefotetan preoperatively.  TN/NUANCE  D:04/10/2018 T:04/11/2018 JOB:[id number]

## 2018-05-27 ENCOUNTER — Telehealth: Payer: Self-pay | Admitting: Family Medicine

## 2018-05-27 NOTE — Telephone Encounter (Signed)
Copied from CRM [phone]. Topic: Quick Communication - See Telephone Encounter >> May 27, 2018  3:13 PM Lorrine KinMcGee, Sinaya Min[place] B, [place] wrote: CRM for notification. See Telephone encounter for: 05/27/18. Patient calling to request approval for changing providers. States that she sees [facility] and would like to see Catarina HartshornAshley Shambley at [facility]. [place] [facility] too far to drive. Please advise.  CB#: [phone]

## 2018-05-28 NOTE — Telephone Encounter (Signed)
Left message on patient voice mail Ok to transfer advised patient to schedule appt at her convenience with Alphonse GuildAshleigh Shambley NP. [facility]

## 2018-05-28 NOTE — Telephone Encounter (Signed)
Okay with me 

## 2018-05-30 ENCOUNTER — Telehealth: Payer: Self-pay | Admitting: General Practice

## 2018-05-30 NOTE — Telephone Encounter (Signed)
Contacted patient to let her know that she would need to be seen in order to get medication refilled as we have not seen her since August 2018.  Also, advised patient to contact [facility] as she previously requested to transfer to that office. Patient voiced understanding and plans to contact [facility] to schedule an appointment.

## 2018-05-30 NOTE — Telephone Encounter (Signed)
Copied from CRM [phone]. Topic: Quick Communication - See Telephone Encounter >> May 30, 2018 12:53 PM Tamela OddiMartin, [place], NT wrote: CRM for notification. See Telephone encounter for: 05/30/18. Patient called and states she needs a refill of her levothyroxine (SYNTHROID, LEVOTHROID) 125 MCG tablet  [id number] - [place], [place] - [address] [facility] [address] [facility] [phone] (Phone) [phone] (Fax)

## 2018-05-30 NOTE — Telephone Encounter (Signed)
Will route to office for provider review; refill note dated states "No refills, will be managed further by OB"; L&D notes state pt delivered 01/01/18; will route to provider for review  Levothyroxine refill Last Refill: 09/07/17  #90 Last OV: 06/04/17 PCP: Dr Claiborne BillingsKuneff Pharmacy: [facility] [address] [place]

## 2018-11-04 LAB — BASIC METABOLIC PANEL
BUN: 9 (ref 4–21)
Creatinine: 0.9 (ref 0.5–1.1)
Glucose: 90
Sodium: 139 (ref 137–147)

## 2018-11-04 LAB — TSH: TSH: 16.73 — AB (ref 0.41–5.90)

## 2018-11-04 LAB — HEPATIC FUNCTION PANEL
Alkaline Phosphatase: 37 (ref 25–125)
Bilirubin, Total: 0.3

## 2018-11-04 LAB — CBC AND DIFFERENTIAL
HCT: 37 (ref 36–46)
Hemoglobin: 11.6 — AB (ref 12.0–16.0)
NEUTROS ABS: 2
Platelets: 327 (ref 150–399)
WBC: 6
